# Patient Record
Sex: Male | Born: 1950 | Race: White | Hispanic: No | Marital: Married | Smoking: Never smoker
Health system: Southern US, Community
[De-identification: ages and names within clinical notes are randomized; demographics above are authoritative.]

## PROBLEM LIST (undated history)

## (undated) DIAGNOSIS — N412 Abscess of prostate: Secondary | ICD-10-CM

## (undated) HISTORY — PX: KNEE ARTHROSCOPY W/ MENISCAL REPAIR: SHX1877

---

## 2014-03-19 ENCOUNTER — Emergency Department
Admission: EM | Admit: 2014-03-19 | Discharge: 2014-03-19 | Disposition: A | Discharge status: Home / Self Care | Payer: 59 | Source: Home / Self Care | Attending: Family Medicine | Admitting: Family Medicine

## 2014-03-19 ENCOUNTER — Encounter: Payer: Self-pay | Admitting: Emergency Medicine

## 2014-03-19 DIAGNOSIS — J029 Acute pharyngitis, unspecified: Secondary | ICD-10-CM

## 2014-03-19 HISTORY — DX: Abscess of prostate: N41.2

## 2014-03-19 LAB — POCT RAPID STREP A (OFFICE): RAPID STREP A SCREEN: NEGATIVE

## 2014-03-19 NOTE — Discharge Instructions (Signed)
If increasing cold symptoms develop, try the following:  Take plain guaifenesin 1200mg  (Mucinex) twice daily, with plenty of water, for cough and congestion.  May add Pseudoephedrine for sinus congestion.  Get adequate rest.   May use Afrin nasal spray (or generic oxymetazoline) twice daily for about 5 days.  Also recommend using saline nasal spray several times daily and saline nasal irrigation (AYR is a common brand) Try warm salt water gargles for sore throat.  Stop all antihistamines for now, and other non-prescription cough/cold preparations. May take Ibuprofen 200mg , 4 tabs every 8 hours with food for sore throat, headache, etc. May take Delsym Cough Suppressant at bedtime for nighttime cough.  Follow-up with family doctor if not improving about10 days.

## 2014-03-19 NOTE — ED Notes (Signed)
Pt c/o sore thoat since yesterday. Started suddenly and getting worse. Pt has been afebrile.

## 2014-03-19 NOTE — ED Provider Notes (Signed)
CSN: [id number]     Arrival date & time 03/19/14  0929 History   First MD Initiated Contact with Patient 03/19/14 1003     Chief Complaint  Patient presents with  . Sore Throat      HPI Comments: Patient complains of onset of a sore throat last night.  Today his throat was worse.  He has developed nasal congestion, post-nasal drainage, myalgias, and mild fatigue.  The history is provided by the patient.    Past Medical History  Diagnosis Date  . Prostate abscess    Past Surgical History  Procedure Laterality Date  . Knee arthroscopy w/ meniscal repair     Family History  Problem Relation Age of Onset  . Diabetes Mother   . Hypertension Mother   . Stroke Mother   . Hypertension Sister   . Heart failure Sister    History  Substance Use Topics  . Smoking status: Never Smoker   . Smokeless tobacco: Not on file  . Alcohol Use: 1.2 oz/week    2 Not specified per week    Review of Systems + sore throat + hoarseness No cough No pleuritic pain No wheezing + nasal congestion + post-nasal drainage No sinus pain/pressure No itchy/red eyes No earache No hemoptysis No SOB No fever/chills No nausea No vomiting No abdominal pain No diarrhea No urinary symptoms No skin rash + fatigue + myalgias + headache Used OTC meds without relief  Allergies  Review of patient's allergies indicates no known allergies.  Home Medications   Prior to Admission medications   Not on File   BP 134/84 mmHg  Pulse 78  Temp(Src) 97.5 F (36.4 C) (Oral)  Ht 5' 8.25" (1.734 m)  Wt 178 lb (80.74 kg)  BMI 26.85 kg/m2  SpO2 99% Physical Exam Nursing notes and Vital Signs reviewed. Appearance:  Patient appears healthy, stated age, and in no acute distress Eyes:  Pupils are equal, round, and reactive to light and accomodation.  Extraocular movement is intact.  Conjunctivae are not inflamed  Ears:  Canals normal.  Tympanic membranes normal.  Nose:  Mildly congested turbinates.  No  sinus tenderness.   Pharynx:  Minimal erythema. Neck:  Supple.  Tender enlarged posterior nodes are palpated bilaterally  Lungs:  Clear to auscultation.  Breath sounds are equal.  Heart:  Regular rate and rhythm without murmurs, rubs, or gallops.  Abdomen:  Nontender without masses or hepatosplenomegaly.  Bowel sounds are present.  No CVA or flank tenderness.  Extremities:  No edema.  No calf tenderness Skin:  No rash present.   ED Course  Procedures  None    Labs Reviewed  STREP A DNA PROBE  POCT RAPID STREP A (OFFICE) negative         MDM   1. Acute pharyngitis, unspecified pharyngitis type; suspect early viral URI    Throat culture pending.  There is no evidence of bacterial infection today.   If increasing cold symptoms develop, try the following:  Take plain guaifenesin 1200mg  (Mucinex) twice daily, with plenty of water, for cough and congestion.  May add Pseudoephedrine for sinus congestion.  Get adequate rest.   May use Afrin nasal spray (or generic oxymetazoline) twice daily for about 5 days.  Also recommend using saline nasal spray several times daily and saline nasal irrigation (AYR is a common brand) Try warm salt water gargles for sore throat.  Stop all antihistamines for now, and other non-prescription cough/cold preparations. May take Ibuprofen 200mg , 4 tabs  every 8 hours with food for sore throat, headache, etc. May take Delsym Cough Suppressant at bedtime for nighttime cough.  Follow-up with family doctor if not improving about10 days.     Kandra Nicolas, MD 03/19/14 [phone]

## 2014-03-20 LAB — STREP A DNA PROBE: GASP: NEGATIVE

## 2014-03-22 ENCOUNTER — Telehealth: Payer: Self-pay | Admitting: *Deleted

## 2015-01-21 ENCOUNTER — Emergency Department
Admission: EM | Admit: 2015-01-21 | Discharge: 2015-01-21 | Disposition: A | Discharge status: Home / Self Care | Payer: 59 | Source: Home / Self Care | Attending: Family Medicine | Admitting: Family Medicine

## 2015-01-21 ENCOUNTER — Encounter: Payer: Self-pay | Admitting: Emergency Medicine

## 2015-01-21 DIAGNOSIS — J069 Acute upper respiratory infection, unspecified: Secondary | ICD-10-CM

## 2015-01-21 DIAGNOSIS — B9789 Other viral agents as the cause of diseases classified elsewhere: Principal | ICD-10-CM

## 2015-01-21 LAB — POCT RAPID STREP A (OFFICE): RAPID STREP A SCREEN: NEGATIVE

## 2015-01-21 NOTE — ED Notes (Signed)
Fever, and sore throat x 6 days

## 2015-01-21 NOTE — Discharge Instructions (Signed)
Take plain guaifenesin (1200mg  extended release tabs such as Mucinex) twice daily, with plenty of water, for cough and congestion.  May add Pseudoephedrine (30mg , one or two every 4 to 6 hours) for sinus congestion.  Get adequate rest.   May use Afrin nasal spray (or generic oxymetazoline) twice daily for about 5 days and then discontinue.  Also recommend using saline nasal spray several times daily and saline nasal irrigation (AYR is a common brand).   Try warm salt water gargles for sore throat.  May take Delsym Cough Suppressant at bedtime for nighttime cough.  Stop all antihistamines for now, and other non-prescription cough/cold preparations. May take Ibuprofen 200mg , 4 tabs every 8 hours with food for sore throat, headache, etc.   Follow-up with family doctor if not improving about10 days.

## 2015-01-21 NOTE — ED Provider Notes (Signed)
CSN: [id number]     Arrival date & time 01/21/15  [id number] History   First MD Initiated Contact with Patient 01/21/15 [phone]     Chief Complaint  Patient presents with  . Sore Throat      HPI Comments: One week ago patient developed low grade fever, fatigue, and myalgias.  Three to four days ago he developed a sore throat which persists.  He has had post-nasal drainage.  He has developed increasing cough over the past two days.  The history is provided by the patient.    Past Medical History  Diagnosis Date  . Prostate abscess    Past Surgical History  Procedure Laterality Date  . Knee arthroscopy w/ meniscal repair     Family History  Problem Relation Age of Onset  . Diabetes Mother   . Hypertension Mother   . Stroke Mother   . Hypertension Sister   . Heart failure Sister    Social History  Substance Use Topics  . Smoking status: Never Smoker   . Smokeless tobacco: None  . Alcohol Use: 1.2 oz/week    2 Standard drinks or equivalent per week    Review of Systems + sore throat + cough No pleuritic pain No wheezing No nasal congestion + post-nasal drainage No sinus pain/pressure No itchy/red eyes No earache No hemoptysis No SOB + fever, + chills No nausea No vomiting No abdominal pain No diarrhea No urinary symptoms No skin rash + fatigue +myalgias + headache Used OTC meds without relief  Allergies  Review of patient's allergies indicates not on file.  Home Medications   Prior to Admission medications   Not on File   Meds Ordered and Administered this Visit  Medications - No data to display  BP 133/78 mmHg  Pulse 76  Temp(Src) 98.5 F (36.9 C) (Oral)  Ht 5\' 8"  (1.727 m)  Wt 165 lb (74.844 kg)  BMI 25.09 kg/m2  SpO2 98% No data found.   Physical Exam Nursing notes and Vital Signs reviewed. Appearance:  Patient appears stated age, and in no acute distress Eyes:  Pupils are equal, round, and reactive to light and accomodation.  Extraocular  movement is intact.  Conjunctivae are not inflamed  Ears:  Canals normal.  Tympanic membranes normal.  Nose:  Congested turbinates.  No sinus tenderness.   Pharynx:  Mild swelling uvula Neck:  Supple.   Tender shotty posterior nodes are palpated bilaterally  Lungs:  Clear to auscultation.  Breath sounds are equal.  Moving air well. Heart:  Regular rate and rhythm without murmurs, rubs, or gallops.  Abdomen:  Nontender without masses or hepatosplenomegaly.  Bowel sounds are present.  No CVA or flank tenderness.  Extremities:  No edema.  Skin:  No rash present.   ED Course  Procedures none    Labs Reviewed -  POCT rapid strep test negative    MDM   1. Viral URI with cough     There is no evidence of bacterial infection today.  Will send throat culture Take plain guaifenesin (1200mg  extended release tabs such as Mucinex) twice daily, with plenty of water, for cough and congestion.  May add Pseudoephedrine (30mg , one or two every 4 to 6 hours) for sinus congestion.  Get adequate rest.   May use Afrin nasal spray (or generic oxymetazoline) twice daily for about 5 days and then discontinue.  Also recommend using saline nasal spray several times daily and saline nasal irrigation (AYR is a common brand).  Try warm salt water gargles for sore throat.  May take Delsym Cough Suppressant at bedtime for nighttime cough.  Stop all antihistamines for now, and other non-prescription cough/cold preparations. May take Ibuprofen 200mg , 4 tabs every 8 hours with food for sore throat, headache, etc.   Follow-up with family doctor if not improving about10 days.     Kandra Nicolas, MD 01/21/15 1005

## 2015-01-22 ENCOUNTER — Telehealth: Payer: Self-pay | Admitting: Emergency Medicine

## 2015-01-22 LAB — STREP A DNA PROBE: GASP: NOT DETECTED

## 2015-07-20 DIAGNOSIS — H524 Presbyopia: Secondary | ICD-10-CM | POA: Diagnosis not present

## 2015-11-17 ENCOUNTER — Ambulatory Visit (INDEPENDENT_AMBULATORY_CARE_PROVIDER_SITE_OTHER): Payer: 59 | Admitting: Physician Assistant

## 2015-11-17 ENCOUNTER — Encounter: Payer: Self-pay | Admitting: Physician Assistant

## 2015-11-17 VITALS — BP 134/68 | HR 65 | Ht 68.0 in | Wt 179.0 lb

## 2015-11-17 DIAGNOSIS — L603 Nail dystrophy: Secondary | ICD-10-CM | POA: Diagnosis not present

## 2015-11-17 DIAGNOSIS — Z87438 Personal history of other diseases of male genital organs: Secondary | ICD-10-CM

## 2015-11-17 DIAGNOSIS — Z0000 Encounter for general adult medical examination without abnormal findings: Secondary | ICD-10-CM | POA: Diagnosis not present

## 2015-11-17 DIAGNOSIS — R002 Palpitations: Secondary | ICD-10-CM | POA: Insufficient documentation

## 2015-11-17 DIAGNOSIS — Z131 Encounter for screening for diabetes mellitus: Secondary | ICD-10-CM

## 2015-11-17 DIAGNOSIS — Z1159 Encounter for screening for other viral diseases: Secondary | ICD-10-CM

## 2015-11-17 DIAGNOSIS — Z13220 Encounter for screening for lipoid disorders: Secondary | ICD-10-CM

## 2015-11-17 DIAGNOSIS — R011 Cardiac murmur, unspecified: Secondary | ICD-10-CM

## 2015-11-17 NOTE — Progress Notes (Signed)
Subjective:    Patient ID: Tyler Russo, male    DOB: 10-15-1950, 65 y.o.   MRN: [id number]  HPI Pt is a 65 yo male who presents to the clinic for CPE.   He has been having some palpitations. He has a heart murmur that has been checked 10 plus years ago. Palpitations occur all throughout the day but only last a few seconds. Not tried anything to make better. Exercises daily.   He did injure his left ring finger a few weeks back. It was a crush injury.now the nail bed has a divet in it and he wants it looked at. No pain, swelling or tenderness.    .. Active Ambulatory Problems    Diagnosis Date Noted  . Palpitations 11/17/2015  . Systolic ejection murmur [id number]  . History of BPH 11/17/2015   Resolved Ambulatory Problems    Diagnosis Date Noted  . No Resolved Ambulatory Problems   Past Medical History:  Diagnosis Date  . Prostate abscess    .Marland Kitchen Family History  Problem Relation Age of Onset  . Diabetes Mother   . Hypertension Mother   . Stroke Mother   . Hypertension Sister   . Heart failure Sister   . Cancer Brother     testicular  . Diabetes Paternal Grandmother   . ALS Brother    .Marland Kitchen Social History   Social History  . Marital status: Married    Spouse name: N/A  . Number of children: N/A  . Years of education: N/A   Occupational History  . Not on file.   Social History Main Topics  . Smoking status: Never Smoker  . Smokeless tobacco: Never Used  . Alcohol use 1.2 oz/week    2 Standard drinks or equivalent per week  . Drug use: No  . Sexual activity: Yes   Other Topics Concern  . Not on file   Social History Narrative  . No narrative on file      Review of Systems  All other systems reviewed and are negative.      Objective:   Physical Exam BP 134/68   Pulse 65   Ht 5\' 8"  (1.727 m)   Wt 179 lb (81.2 kg)   BMI 27.22 kg/m   General Appearance:    Alert, cooperative, no distress, appears stated age  Head:    Normocephalic, without  obvious abnormality, atraumatic  Eyes:    PERRL, conjunctiva/corneas clear, EOM's intact, fundi    benign, both eyes       Ears:    Normal TM's and external ear canals, both ears  Nose:   Nares normal, septum midline, mucosa normal, no drainage    or sinus tenderness  Throat:   Lips, mucosa, and tongue normal; teeth and gums normal  Neck:   Supple, symmetrical, trachea midline, no adenopathy;       thyroid:  No enlargement/tenderness/nodules; no carotid   bruit or JVD  Back:     Symmetric, no curvature, ROM normal, no CVA tenderness  Lungs:     Clear to auscultation bilaterally, respirations unlabored  Chest wall:    No tenderness or deformity  Heart:    Regular rate and rhythm, S1 and S2 normal, 2/6 systolic  murmur, rub   or gallop  Abdomen:     Soft, non-tender, bowel sounds active all four quadrants,    no masses, no organomegaly        Extremities:   Extremities normal, atraumatic, no cyanosis or  edema  Pulses:   2+ and symmetric all extremities  Skin:   Skin color, texture, turgor normal, no rashes or lesions/left ring finger dystrophic nail with a divet from just above nail bed where crush injury occured to the tip of the nail.   Lymph nodes:   Cervical, supraclavicular, and axillary nodes normal  Neurologic:   CNII-XII intact. Normal strength, sensation and reflexes      throughout         Assessment & Plan:  Marland KitchenMarland KitchenDiagnoses and all orders for this visit:  Routine physical examination -     Lipid panel -     COMPLETE METABOLIC PANEL WITH GFR -     TSH -     VITAMIN D 25 Hydroxy (Vit-D Deficiency, Fractures) -     B12 -     CBC with Differential/Platelet -     Ferritin -     Hepatitis C Antibody -     PSA  Palpitations -     COMPLETE METABOLIC PANEL WITH GFR -     TSH -     VITAMIN D 25 Hydroxy (Vit-D Deficiency, Fractures) -     B12 -     CBC with Differential/Platelet -     Ferritin -     EKG 12-Lead  Need for hepatitis C screening test -     Hepatitis C  Antibody  Screening for diabetes mellitus -     COMPLETE METABOLIC PANEL WITH GFR  Screening for lipid disorders -     Lipid panel  Systolic ejection murmur  Dystrophic nail -     Fungal Stain  History of BPH   Palpitations-  EKG- no arrhthymias No ST elevation or depression. NSR at 65.  Discussed likely PVC's.  Labs ordered.  Encouraged ASA 81mg  daily.  HO given.  Will get holter monitor to further evaluate.  Discussed symptomatic care with beta blocker. Pt declined today.  Hx of BPH- AUA was 6. Controlled. PSA ordered.   Systolic murmur- discussed echo. Pt declined today. Discussed warning signs we need to look into heart blood flow.

## 2015-11-17 NOTE — Patient Instructions (Signed)
Get labs.  Suggest starting a baby ASA a day.  Will order holter.    Premature Ventricular Contraction A premature ventricular contraction is an irregularity in the normal heart rhythm. These contractions are extra heartbeats that occur too early in the normal sequence. In most cases, these contractions are harmless and do not require treatment. CAUSES Premature ventricular contractions may occur without a known cause. In healthy people, the extra contractions may be caused by:  Smoking.  Drinking alcohol.  Caffeine.  Certain medicines.  Some illegal drugs.  Stress. Sometimes, changes in chemicals in the blood (electrolytes) can also cause premature ventricular contractions. They can also occur in people with heart diseases that cause a decrease in blood flow to the heart. SIGNS AND SYMPTOMS Premature ventricular contractions often do not cause any symptoms. In some cases, you may have a feeling of your heart beating fast or skipping a beat (palpitations). DIAGNOSIS Your health care provider will take your medical history and do a physical exam. During the exam, the health care provider will check for irregular heartbeats. Various tests may be done to help diagnose premature ventricular contractions. These tests may include:  An ECG (electrocardiogram) to monitor the electrical activity of your heart.  Holter monitor testing. A Holter monitor is a portable device that can monitor the electrical activity of your heart over longer periods of time.  Stress tests to see how exercise affects your heart rhythm.  Echocardiogram. This test uses sound waves (ultrasound) to produce an image of your heart.  Electrophysiology study. This is used to evaluate the electrical conduction system of your heart. TREATMENT Usually, no treatment is needed. You may be advised to avoid things that can trigger the premature contractions, such as caffeine or alcohol. Medicines are sometimes given if  symptoms are severe or if the extra heartbeats are very frequent. Treatment may also be needed for an underlying cause of the contractions if one is found. HOME CARE INSTRUCTIONS  Take medicines only as directed by your health care provider.  Make any lifestyle changes recommended by your health care provider. These may include:  Quitting smoking.  Avoiding or limiting caffeine or alcohol.  Exercising. Talk to your health care provider about what type of exercise is safe for you.  Trying to reduce stress.  Keep all follow-up visits with your health care provider. This is important. SEEK IMMEDIATE MEDICAL CARE IF:  You feel palpitations that are frequent or continual.  You have chest pain.  You have shortness of breath.  You have sweating for no reason.  You have nausea and vomiting.  You become light-headed or faint.   This information is not intended to replace advice given to you by your health care provider. Make sure you discuss any questions you have with your health care provider.   Document Released: 09/18/2003 Document Revised: 02/21/2014 Document Reviewed: 07/04/2013 Elsevier Interactive Patient Education [employer].

## 2015-11-20 DIAGNOSIS — R002 Palpitations: Secondary | ICD-10-CM | POA: Diagnosis not present

## 2015-11-20 DIAGNOSIS — Z1159 Encounter for screening for other viral diseases: Secondary | ICD-10-CM | POA: Diagnosis not present

## 2015-11-20 DIAGNOSIS — Z0000 Encounter for general adult medical examination without abnormal findings: Secondary | ICD-10-CM | POA: Diagnosis not present

## 2015-11-20 DIAGNOSIS — Z13220 Encounter for screening for lipoid disorders: Secondary | ICD-10-CM | POA: Diagnosis not present

## 2015-11-20 DIAGNOSIS — Z131 Encounter for screening for diabetes mellitus: Secondary | ICD-10-CM | POA: Diagnosis not present

## 2015-11-20 LAB — CBC WITH DIFFERENTIAL/PLATELET
BASOS ABS: 0 {cells}/uL (ref 0–200)
Basophils Relative: 0 %
Eosinophils Absolute: 124 cells/uL (ref 15–500)
Eosinophils Relative: 2 %
HEMATOCRIT: 40 % (ref 38.5–50.0)
Hemoglobin: 13.8 g/dL (ref 13.2–17.1)
LYMPHS ABS: 1736 {cells}/uL (ref 850–3900)
Lymphocytes Relative: 28 %
MCH: 30.8 pg (ref 27.0–33.0)
MCHC: 34.5 g/dL (ref 32.0–36.0)
MCV: 89.3 fL (ref 80.0–100.0)
MONO ABS: 620 {cells}/uL (ref 200–950)
MPV: 10 fL (ref 7.5–12.5)
Monocytes Relative: 10 %
NEUTROS ABS: 3720 {cells}/uL (ref 1500–7800)
Neutrophils Relative %: 60 %
Platelets: 201 10*3/uL (ref 140–400)
RBC: 4.48 MIL/uL (ref 4.20–5.80)
RDW: 12.9 % (ref 11.0–15.0)
WBC: 6.2 10*3/uL (ref 3.8–10.8)

## 2015-11-20 LAB — COMPLETE METABOLIC PANEL WITH GFR
ALK PHOS: 62 U/L (ref 40–115)
ALT: 19 U/L (ref 9–46)
AST: 24 U/L (ref 10–35)
Albumin: 4.1 g/dL (ref 3.6–5.1)
BILIRUBIN TOTAL: 0.4 mg/dL (ref 0.2–1.2)
BUN: 20 mg/dL (ref 7–25)
CO2: 25 mmol/L (ref 20–31)
Calcium: 9.2 mg/dL (ref 8.6–10.3)
Chloride: 105 mmol/L (ref 98–110)
Creat: 0.91 mg/dL (ref 0.70–1.25)
GFR, Est African American: >89 mL/min (ref >=60)
GFR, Est Non African American: 89 mL/min (ref >=60)
GLUCOSE: 94 mg/dL (ref 65–99)
Potassium: 4.2 mmol/L (ref 3.5–5.3)
SODIUM: 138 mmol/L (ref 135–146)
TOTAL PROTEIN: 7 g/dL (ref 6.1–8.1)

## 2015-11-20 LAB — LIPID PANEL
Cholesterol: 168 mg/dL (ref 125–200)
HDL: 48 mg/dL (ref >=40)
LDL Cholesterol: 102 mg/dL (ref <130)
Total CHOL/HDL Ratio: 3.5 Ratio (ref <=5.0)
Triglycerides: 91 mg/dL (ref <150)
VLDL: 18 mg/dL (ref <30)

## 2015-11-20 LAB — TSH: TSH: 1.95 mIU/L (ref 0.40–4.50)

## 2015-11-20 LAB — VITAMIN B12: Vitamin B-12: 1277 pg/mL — ABNORMAL HIGH (ref 200–1100)

## 2015-11-20 LAB — HEPATITIS C ANTIBODY: HCV AB: NEGATIVE

## 2015-11-20 LAB — FERRITIN: Ferritin: 22 ng/mL (ref 20–380)

## 2015-11-20 LAB — PSA: PSA: 1.4 ng/mL (ref <=4.0)

## 2015-11-20 LAB — FUNGAL STAIN

## 2015-11-20 LAB — VITAMIN D 25 HYDROXY (VIT D DEFICIENCY, FRACTURES): Vit D, 25-Hydroxy: 35 ng/mL (ref 30–100)

## 2015-12-02 ENCOUNTER — Ambulatory Visit (INDEPENDENT_AMBULATORY_CARE_PROVIDER_SITE_OTHER): Payer: 59

## 2015-12-02 DIAGNOSIS — R002 Palpitations: Secondary | ICD-10-CM | POA: Diagnosis not present

## 2015-12-17 ENCOUNTER — Encounter: Payer: Self-pay | Admitting: Physician Assistant

## 2015-12-18 ENCOUNTER — Other Ambulatory Visit: Payer: Self-pay | Admitting: Physician Assistant

## 2015-12-18 DIAGNOSIS — R9431 Abnormal electrocardiogram [ECG] [EKG]: Secondary | ICD-10-CM

## 2015-12-18 DIAGNOSIS — R002 Palpitations: Secondary | ICD-10-CM

## 2015-12-29 ENCOUNTER — Ambulatory Visit (INDEPENDENT_AMBULATORY_CARE_PROVIDER_SITE_OTHER): Payer: 59

## 2015-12-29 ENCOUNTER — Other Ambulatory Visit: Payer: Self-pay

## 2015-12-29 ENCOUNTER — Encounter: Payer: Self-pay | Admitting: Physician Assistant

## 2015-12-29 DIAGNOSIS — R9439 Abnormal result of other cardiovascular function study: Secondary | ICD-10-CM | POA: Insufficient documentation

## 2015-12-29 DIAGNOSIS — R9431 Abnormal electrocardiogram [ECG] [EKG]: Secondary | ICD-10-CM | POA: Diagnosis not present

## 2015-12-29 DIAGNOSIS — R002 Palpitations: Secondary | ICD-10-CM | POA: Diagnosis not present

## 2015-12-29 LAB — EXERCISE TOLERANCE TEST
CHL CUP RESTING HR STRESS: 71 {beats}/min
CHL CUP STRESS STAGE 2 GRADE: 0 %
CHL CUP STRESS STAGE 2 SPEED: 0 mph
CHL CUP STRESS STAGE 3 GRADE: 0 %
CHL CUP STRESS STAGE 3 HR: 79 {beats}/min
CHL CUP STRESS STAGE 5 GRADE: 10 %
CHL CUP STRESS STAGE 5 HR: 118 {beats}/min
CHL CUP STRESS STAGE 6 DBP: 63 mmHg
CHL CUP STRESS STAGE 6 GRADE: 12 %
CHL CUP STRESS STAGE 6 SBP: 186 mmHg
CHL CUP STRESS STAGE 6 SPEED: 2.5 mph
CHL CUP STRESS STAGE 7 HR: 144 {beats}/min
CHL CUP STRESS STAGE 8 HR: 115 {beats}/min
CHL CUP STRESS STAGE 8 SBP: 159 mmHg
CHL CUP STRESS STAGE 9 HR: 84 {beats}/min
CHL RATE OF PERCEIVED EXERTION: 16
CSEPED: 7 min
CSEPEDS: 0 s
CSEPPHR: 144 {beats}/min
CSEPPMHR: 91 %
Estimated workload: 8.5 METS
MPHR: 157 {beats}/min
Percent HR: 91 %
Stage 1 DBP: 84 mmHg
Stage 1 Grade: 0 %
Stage 1 HR: 79 {beats}/min
Stage 1 SBP: 136 mmHg
Stage 1 Speed: 0 mph
Stage 2 HR: 78 {beats}/min
Stage 3 Speed: 1 mph
Stage 4 Grade: 0.1 %
Stage 4 HR: 79 {beats}/min
Stage 4 Speed: 1 mph
Stage 5 DBP: 63 mmHg
Stage 5 SBP: 151 mmHg
Stage 5 Speed: 1.7 mph
Stage 6 HR: 137 {beats}/min
Stage 7 Grade: 14 %
Stage 7 Speed: 3.4 mph
Stage 8 DBP: 68 mmHg
Stage 8 Grade: 0 %
Stage 8 Speed: 0 mph
Stage 9 DBP: 76 mmHg
Stage 9 Grade: 0 %
Stage 9 SBP: 141 mmHg
Stage 9 Speed: 0 mph

## 2016-01-05 ENCOUNTER — Telehealth (HOSPITAL_COMMUNITY): Payer: Self-pay | Admitting: *Deleted

## 2016-01-05 NOTE — Telephone Encounter (Signed)
Patient given detailed instructions per Myocardial Perfusion Study Information Sheet for the test on 01/12/16 at 0715. Patient notified to arrive 15 minutes early and that it is imperative to arrive on time for appointment to keep from having the test rescheduled.  If you need to cancel or reschedule your appointment, please call the office within 24 hours of your appointment. Failure to do so may result in a cancellation of your appointment, and a $50 no show fee. Patient verbalized understanding.Berdell Nevitt, Ranae Palms

## 2016-01-12 ENCOUNTER — Ambulatory Visit (HOSPITAL_COMMUNITY): Payer: 59 | Attending: Internal Medicine

## 2016-01-12 DIAGNOSIS — R9439 Abnormal result of other cardiovascular function study: Secondary | ICD-10-CM | POA: Diagnosis not present

## 2016-01-12 DIAGNOSIS — R002 Palpitations: Secondary | ICD-10-CM | POA: Diagnosis not present

## 2016-01-12 LAB — MYOCARDIAL PERFUSION IMAGING
CHL CUP NUCLEAR SDS: 1
CSEPPHR: 134 {beats}/min
Estimated workload: 10.4 METS
Exercise duration (min): 9 min
Exercise duration (sec): 0 s
LHR: 0.28
LV sys vol: 42 mL
LVDIAVOL: 103 mL (ref 62–150)
MPHR: 157 {beats}/min
Percent HR: 85 %
RPE: 17
Rest HR: 51 {beats}/min
SRS: 5
SSS: 6
TID: 0.95

## 2016-01-12 MED ORDER — TECHNETIUM TC 99M TETROFOSMIN IV KIT
10.5000 | PACK | Freq: Once | INTRAVENOUS | Status: AC | PRN
  Administered 2016-01-12: 10.5 via INTRAVENOUS
  Filled 2016-01-12: qty 11

## 2016-01-12 MED ORDER — TECHNETIUM TC 99M TETROFOSMIN IV KIT
33.0000 | PACK | Freq: Once | INTRAVENOUS | Status: AC | PRN
  Administered 2016-01-12: 33 via INTRAVENOUS
  Filled 2016-01-12: qty 33

## 2016-07-08 ENCOUNTER — Emergency Department (INDEPENDENT_AMBULATORY_CARE_PROVIDER_SITE_OTHER)
Admission: EM | Admit: 2016-07-08 | Discharge: 2016-07-08 | Disposition: A | Discharge status: Home / Self Care | Payer: 59 | Source: Home / Self Care | Attending: Family Medicine | Admitting: Family Medicine

## 2016-07-08 ENCOUNTER — Encounter: Payer: Self-pay | Admitting: Emergency Medicine

## 2016-07-08 DIAGNOSIS — J029 Acute pharyngitis, unspecified: Secondary | ICD-10-CM | POA: Diagnosis not present

## 2016-07-08 LAB — POCT RAPID STREP A (OFFICE): Rapid Strep A Screen: NEGATIVE

## 2016-07-08 NOTE — Discharge Instructions (Signed)
Try warm salt water gargles for sore throat.  May take Ibuprofen 200mg , 4 tabs every 8 hours with food for sore throat  If increasing cold symptoms start, try the following: Take plain guaifenesin (1200mg  extended release tabs such as Mucinex) twice daily, with plenty of water, for cough and congestion.  May add Pseudoephedrine (30mg , one or two every 4 to 6 hours) for sinus congestion.  Get adequate rest.   May use Afrin nasal spray (or generic oxymetazoline) each morning for about 5 days and then discontinue.  Also recommend using saline nasal spray several times daily and saline nasal irrigation (AYR is a common brand).  Use Flonase nasal spray each morning after using Afrin nasal spray and saline nasal irrigation. May take Delsym Cough Suppressant at bedtime for nighttime cough.  Stop all antihistamines for now, and other non-prescription cough/cold preparations.

## 2016-07-08 NOTE — ED Triage Notes (Signed)
Sore throat, fatigue, headache x 5 days

## 2016-07-08 NOTE — ED Provider Notes (Signed)
[facility]    CSN: [id number] Arrival date & time: 07/08/16  0820     History   Chief Complaint Chief Complaint  Patient presents with  . Sore Throat    HPI Tyler Russo is a 66 y.o. male.   Patient developed a sore throat four days ago that has been gradually worsening.  He has had mild nasal congestion but no other URI symptoms.  No fevers, chills, and sweats.  His 48 year old grandson was treated for strep pharyngitis 1.5 weeks ago.   The history is provided by the patient.    Past Medical History:  Diagnosis Date  . Prostate abscess     Patient Active Problem List   Diagnosis Date Noted  . Abnormal stress test 12/29/2015  . Palpitations 11/17/2015  . Systolic ejection murmur 81/02/7508  . History of BPH 11/17/2015    Past Surgical History:  Procedure Laterality Date  . KNEE ARTHROSCOPY W/ MENISCAL REPAIR         Home Medications    Prior to Admission medications   Medication Sig Start Date End Date Taking? Authorizing Provider  ibuprofen (ADVIL,MOTRIN) 200 MG tablet Take 200 mg by mouth every 6 (six) hours as needed.   Yes [provider]    Family History Family History  Problem Relation Age of Onset  . Diabetes Mother   . Hypertension Mother   . Stroke Mother   . Hypertension Sister   . Heart failure Sister   . Cancer Brother        testicular  . Diabetes Paternal Grandmother   . ALS Brother     Social History Social History  Substance Use Topics  . Smoking status: Never Smoker  . Smokeless tobacco: Never Used  . Alcohol use 1.2 oz/week    2 Standard drinks or equivalent per week     Allergies   Patient has no known allergies.   Review of Systems Review of Systems + sore throat No cough No pleuritic pain No wheezing + nasal congestion ? post-nasal drainage No sinus pain/pressure No itchy/red eyes No earache No hemoptysis No SOB No fever/chills No nausea No vomiting No abdominal pain No  diarrhea No urinary symptoms No skin rash No fatigue No myalgias No headache Used OTC meds without relief   Physical Exam Triage Vital Signs ED Triage Vitals  Enc Vitals Group     BP 07/08/16 0835 135/82     Pulse Rate 07/08/16 0835 77     Resp --      Temp 07/08/16 0835 98.3 F (36.8 C)     Temp Source 07/08/16 0835 Oral     SpO2 07/08/16 0835 97 %     Weight 07/08/16 0835 168 lb (76.2 kg)     Height 07/08/16 0835 5\' 8"  (1.727 m)     Head Circumference --      Peak Flow --      Pain Score 07/08/16 0836 6     Pain Loc --      Pain Edu? --      Excl. in [place]? --    No data found.   Updated Vital Signs BP 135/82 (BP Location: Left Arm)   Pulse 77   Temp 98.3 F (36.8 C) (Oral)   Ht 5\' 8"  (1.727 m)   Wt 168 lb (76.2 kg)   SpO2 97%   BMI 25.54 kg/m   Visual Acuity Right Eye Distance:   Left Eye Distance:   Bilateral  Distance:    Right Eye Near:   Left Eye Near:    Bilateral Near:     Physical Exam Nursing notes and Vital Signs reviewed. Appearance:  Patient appears stated age, and in no acute distress Eyes:  Pupils are equal, round, and reactive to light and accomodation.  Extraocular movement is intact.  Conjunctivae are not inflamed  Ears:  Canals normal.  Tympanic membranes normal.  Nose:  Mildly congested turbinates.  No sinus tenderness.  Pharynx:  Mild erythema of uvula. Neck:  Supple.  Tender enlarged posterior/lateral nodes are palpated bilaterally  Lungs:  Clear to auscultation.  Breath sounds are equal.  Moving air well. Heart:  Regular rate and rhythm without murmurs, rubs, or gallops.  Abdomen:  Nontender without masses or hepatosplenomegaly.  Bowel sounds are present.  No CVA or flank tenderness.  Extremities:  No edema.  Skin:  No rash present.    UC Treatments / Results  Labs (all labs ordered are listed, but only abnormal results are displayed) Labs Reviewed - No data to display  EKG  EKG Interpretation None       Radiology No  results found.  Procedures Procedures (including critical care time)  Medications Ordered in UC Medications - No data to display   Initial Impression / Assessment and Plan / UC Course  I have reviewed the triage vital signs and the nursing notes.  Pertinent labs & imaging results that were available during my care of the patient were reviewed by me and considered in my medical decision making (see chart for details).    Suspect early viral URI.  There is no evidence of bacterial infection today.   Throat culture pending. Treat symptomatically for now  Try warm salt water gargles for sore throat.  May take Ibuprofen 200mg , 4 tabs every 8 hours with food for sore throat  If increasing cold symptoms start, try the following: Take plain guaifenesin (1200mg  extended release tabs such as Mucinex) twice daily, with plenty of water, for cough and congestion.  May add Pseudoephedrine (30mg , one or two every 4 to 6 hours) for sinus congestion.  Get adequate rest.   May use Afrin nasal spray (or generic oxymetazoline) each morning for about 5 days and then discontinue.  Also recommend using saline nasal spray several times daily and saline nasal irrigation (AYR is a common brand).  Use Flonase nasal spray each morning after using Afrin nasal spray and saline nasal irrigation. May take Delsym Cough Suppressant at bedtime for nighttime cough.  Stop all antihistamines for now, and other non-prescription cough/cold preparations.    Final Clinical Impressions(s) / UC Diagnoses   Final diagnoses:  Pharyngitis, unspecified etiology    New Prescriptions New Prescriptions   No medications on file     Kandra Nicolas, MD 07/08/16 [phone]

## 2016-07-09 ENCOUNTER — Telehealth: Payer: Self-pay | Admitting: Emergency Medicine

## 2016-07-09 LAB — STREP A DNA PROBE: GASP: NOT DETECTED

## 2016-07-09 NOTE — Telephone Encounter (Signed)
Pt informed of results and states that he's doing better.  [place]

## 2016-08-22 ENCOUNTER — Encounter: Payer: Self-pay | Admitting: Physician Assistant

## 2016-08-22 DIAGNOSIS — Z789 Other specified health status: Secondary | ICD-10-CM

## 2016-09-13 DIAGNOSIS — L821 Other seborrheic keratosis: Secondary | ICD-10-CM | POA: Diagnosis not present

## 2016-09-13 DIAGNOSIS — D225 Melanocytic nevi of trunk: Secondary | ICD-10-CM | POA: Diagnosis not present

## 2016-09-13 DIAGNOSIS — D485 Neoplasm of uncertain behavior of skin: Secondary | ICD-10-CM | POA: Diagnosis not present

## 2016-09-13 DIAGNOSIS — L219 Seborrheic dermatitis, unspecified: Secondary | ICD-10-CM | POA: Diagnosis not present

## 2016-09-13 MED FILL — FLUOCINOLONE 0.01% SCALP OI: 0.01 | 15 days supply | Qty: 118 | Fill #0

## 2016-09-13 MED FILL — KETOCONAZOLE 2% SHAMPOO: 2 | 20 days supply | Qty: 120 | Fill #0

## 2016-09-23 DIAGNOSIS — H524 Presbyopia: Secondary | ICD-10-CM | POA: Diagnosis not present

## 2016-10-20 MED FILL — CICLOPIROX 1% SHAMPOO: 1 | 30 days supply | Qty: 120 | Fill #0

## 2016-12-02 ENCOUNTER — Ambulatory Visit (INDEPENDENT_AMBULATORY_CARE_PROVIDER_SITE_OTHER): Payer: 59 | Admitting: Physician Assistant

## 2016-12-02 ENCOUNTER — Encounter: Payer: Self-pay | Admitting: Physician Assistant

## 2016-12-02 VITALS — BP 114/74 | HR 80 | Temp 98.4°F | Wt 181.0 lb

## 2016-12-02 DIAGNOSIS — R05 Cough: Secondary | ICD-10-CM | POA: Diagnosis not present

## 2016-12-02 DIAGNOSIS — R058 Other specified cough: Secondary | ICD-10-CM

## 2016-12-02 MED ORDER — HYDROCOD POLST-CPM POLST ER 10-8 MG/5ML PO SUER: 5.0000 mL | Freq: Every evening | ORAL | 0 refills | Status: DC | PRN

## 2016-12-02 MED ORDER — BENZONATATE 200 MG PO CAPS: 200.0000 mg | ORAL_CAPSULE | Freq: Three times a day (TID) | ORAL | 0 refills | Status: DC | PRN

## 2016-12-02 MED ORDER — AZITHROMYCIN 250 MG PO TABS: ORAL_TABLET | ORAL | 0 refills | Status: DC

## 2016-12-02 MED FILL — AZITHROMYCIN 250 MG TABLET: 250 | 5 days supply | Qty: 6 | Fill #0

## 2016-12-02 MED FILL — HYDROCODONE-CHLORPHENIRAM S: 10-8 | 24 days supply | Qty: 120 | Fill #0

## 2016-12-02 MED FILL — BENZONATATE 200 MG CAP: 200 | 15 days supply | Qty: 45 | Fill #0

## 2016-12-02 NOTE — Progress Notes (Signed)
HPI:                                                                Tyler Russo is a 66 y.o. male who presents to [facility]: [address] today for cough  Cough  This is a new problem. The current episode started in the past 7 days. The problem has been gradually worsening. The problem occurs every few minutes. The cough is productive of purulent sputum. Associated symptoms include a fever (100-102.5) and a sore throat. Pertinent negatives include no hemoptysis. Risk factors for lung disease include travel (x 3 weeks). Treatments tried: Advil. The treatment provided no relief. There is no history of asthma, COPD or pneumonia.     Past Medical History:  Diagnosis Date  . Prostate abscess    Past Surgical History:  Procedure Laterality Date  . KNEE ARTHROSCOPY W/ MENISCAL REPAIR     Social History  Substance Use Topics  . Smoking status: Never Smoker  . Smokeless tobacco: Never Used  . Alcohol use 1.2 oz/week    2 Standard drinks or equivalent per week   family history includes ALS in his brother; Cancer in his brother; Diabetes in his mother and paternal grandmother; Heart failure in his sister; Hypertension in his mother and sister; Stroke in his mother.  ROS: negative except as noted in the HPI  Medications: Current Outpatient Prescriptions  Medication Sig Dispense Refill  . ibuprofen (ADVIL,MOTRIN) 200 MG tablet Take 200 mg by mouth every 6 (six) hours as needed.     No current facility-administered medications for this visit.    No Known Allergies     Objective:  BP 114/74   Pulse 80   Temp 98.4 F (36.9 C) (Oral)   Wt 181 lb (82.1 kg)   SpO2 95%   BMI 27.52 kg/m  Gen:  alert, ill-appearing, not toxic-appearing, no distress, appropriate for age 71: head normocephalic without obvious abnormality, conjunctiva and cornea clear, oropharynx with erythema, left tonsillar pillar with erythematous nodule, trachea midline Pulm:  Normal work of breathing, normal phonation, clear to auscultation bilaterally, no wheezes, rales or rhonchi CV: Normal rate, regular rhythm, s1 and s2 distinct, grade II-III/VI systolic murmur, clicks or rubs  Neuro: alert and oriented x 3, no tremor MSK: extremities atraumatic, normal gait and station Skin: intact, no rashes on exposed skin, no cyanosis   No flowsheet data found.   No results found for this or any previous visit (from the past 72 hour(s)). No results found.    Assessment and Plan: 66 y.o. male with   1. Cough productive of purulent sputum - vital signs reviewed and normal. No tachycardia, fever, or adventitious lung sounds on exam. No indication for chest x-ray today. Patient has recently traveled by airplane to [place], symptoms present for >1 week, worsening. Will cover for CAP and pertussis empirically with Macrolide. Symptomatic management with cough suppressant. Follow-up as needed if no improvement. - chlorpheniramine-HYDROcodone ([place]) 10-8 MG/5ML SUER; Take 5 mLs by mouth at bedtime as needed for cough (cough, will cause drowsiness.).  Dispense: 120 mL; Refill: 0 - azithromycin (ZITHROMAX Z-PAK) 250 MG tablet; Take 2 tablets (500 mg) on  Day 1,  followed by 1 tablet (250 mg) once daily on Days  2 through 5.  Dispense: 6 tablet; Refill: 0 - benzonatate (TESSALON) 200 MG capsule; Take 1 capsule (200 mg total) by mouth 3 (three) times daily as needed for cough.  Dispense: 45 capsule; Refill: 0   Patient education and anticipatory guidance given Patient agrees with treatment plan Follow-up as needed if symptoms worsen or fail to improve  Darlyne Russian PA-C

## 2016-12-02 NOTE — Patient Instructions (Signed)
Cough, Adult Coughing is a reflex that clears your throat and your airways. Coughing helps to heal and protect your lungs. It is normal to cough occasionally, but a cough that happens with other symptoms or lasts a long time may be a sign of a condition that needs treatment. A cough may last only 2-3 weeks (acute), or it may last longer than 8 weeks (chronic). What are the causes? Coughing is commonly caused by:  Breathing in substances that irritate your lungs.  A viral or bacterial respiratory infection.  Allergies.  Asthma.  Postnasal drip.  Smoking.  Acid backing up from the stomach into the esophagus (gastroesophageal reflux).  Certain medicines.  Chronic lung problems, including COPD (or rarely, lung cancer).  Other medical conditions such as heart failure.  Follow these instructions at home: Pay attention to any changes in your symptoms. Take these actions to help with your discomfort:  Take medicines only as told by your health care provider. ? If you were prescribed an antibiotic medicine, take it as told by your health care provider. Do not stop taking the antibiotic even if you start to feel better. ? Talk with your health care provider before you take a cough suppressant medicine.  Drink enough fluid to keep your urine clear or pale yellow.  If the air is dry, use a cold steam vaporizer or humidifier in your bedroom or your home to help loosen secretions.  Avoid anything that causes you to cough at work or at home.  If your cough is worse at night, try sleeping in a semi-upright position.  Avoid cigarette smoke. If you smoke, quit smoking. If you need help quitting, ask your health care provider.  Avoid caffeine.  Avoid alcohol.  Rest as needed.  Contact a health care provider if:  You have new symptoms.  You cough up pus.  Your cough does not get better after 2-3 weeks, or your cough gets worse.  You cannot control your cough with suppressant  medicines and you are losing sleep.  You develop pain that is getting worse or pain that is not controlled with pain medicines.  You have a fever.  You have unexplained weight loss.  You have night sweats. Get help right away if:  You cough up blood.  You have difficulty breathing.  Your heartbeat is very fast. This information is not intended to replace advice given to you by your health care provider. Make sure you discuss any questions you have with your health care provider. Document Released: 07/30/2010 Document Revised: 07/09/2015 Document Reviewed: 04/09/2014 Elsevier Interactive Patient Education  2017 Elsevier Inc.  

## 2017-03-01 ENCOUNTER — Ambulatory Visit (INDEPENDENT_AMBULATORY_CARE_PROVIDER_SITE_OTHER): Payer: No Typology Code available for payment source | Admitting: Physician Assistant

## 2017-03-01 ENCOUNTER — Encounter: Payer: Self-pay | Admitting: Physician Assistant

## 2017-03-01 VITALS — BP 126/66 | HR 70 | Ht 68.0 in | Wt 180.0 lb

## 2017-03-01 DIAGNOSIS — Z0000 Encounter for general adult medical examination without abnormal findings: Secondary | ICD-10-CM

## 2017-03-01 DIAGNOSIS — R011 Cardiac murmur, unspecified: Secondary | ICD-10-CM | POA: Diagnosis not present

## 2017-03-01 DIAGNOSIS — I493 Ventricular premature depolarization: Secondary | ICD-10-CM | POA: Diagnosis not present

## 2017-03-01 DIAGNOSIS — Z131 Encounter for screening for diabetes mellitus: Secondary | ICD-10-CM

## 2017-03-01 DIAGNOSIS — G43109 Migraine with aura, not intractable, without status migrainosus: Secondary | ICD-10-CM

## 2017-03-01 DIAGNOSIS — I4719 Other supraventricular tachycardia: Secondary | ICD-10-CM

## 2017-03-01 DIAGNOSIS — Z13220 Encounter for screening for lipoid disorders: Secondary | ICD-10-CM | POA: Diagnosis not present

## 2017-03-01 DIAGNOSIS — I471 Supraventricular tachycardia: Secondary | ICD-10-CM | POA: Diagnosis not present

## 2017-03-01 MED ORDER — ATENOLOL 25 MG PO TABS: 25.0000 mg | ORAL_TABLET | Freq: Every day | ORAL | 5 refills | Status: DC

## 2017-03-01 MED FILL — ATENOLOL 25 MG TABLET: 25 | 90 days supply | Qty: 90 | Fill #0

## 2017-03-01 NOTE — Progress Notes (Addendum)
Subjective:    Patient ID: Tyler Russo, male    DOB: 1950-07-10, 67 y.o.   MRN: [id number]  HPI  Pt is a 67 year old male who presents to the clinic for CPE.   He continues to have some palpitations that are more noticeable at night and are bothering him. He had a full work-up including nuclear echo with cardiology and they reported rare PVCs but gave him no medications. He reports they said it was up to him whether to begin a medication such as a beta blocker to manage his symptoms.  He also reports ocular migraines about 4 times a year but they typically only last 20 minutes and are not that bothersome. He denies issues with abdominal pain, night sweats, and urinary retention or urgency. He reports healthy diet and walks for exercise typically a couple miles a day. He has no other complaints.   .. Active Ambulatory Problems    Diagnosis Date Noted  . Palpitations 11/17/2015  . Systolic ejection murmur 94/76/5465  . History of BPH 11/17/2015  . Abnormal stress test 12/29/2015  . 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 PVC's (premature ventricular contractions) 03/01/2017  . Atrial tachycardia determined by electrocardiography ([facility]) 03/01/2017   Resolved Ambulatory Problems    Diagnosis Date Noted  . No Resolved Ambulatory Problems   Past Medical History:  Diagnosis Date  . Prostate abscess    .Marland Kitchen Family History  Problem Relation Age of Onset  . Diabetes Mother   . Hypertension Mother   . Stroke Mother   . Hypertension Sister   . Heart failure Sister   . Cancer Brother        testicular  . Diabetes Paternal Grandmother   . ALS Brother    .Marland Kitchen Social History   Socioeconomic History  . Marital status: Married    Spouse name: Not on file  . Number of children: Not on file  . Years of education: Not on file  . Highest education level: Not on file  Social Needs  . Financial resource strain: Not on file  . Food insecurity - worry: Not on file  . Food insecurity - inability: Not  on file  . Transportation needs - medical: Not on file  . Transportation needs - non-medical: Not on file  Occupational History  . Not on file  Tobacco Use  . Smoking status: Never Smoker  . Smokeless tobacco: Never Used  Substance and Sexual Activity  . Alcohol use: Yes    Alcohol/week: 1.2 oz    Types: 2 Standard drinks or equivalent per week  . Drug use: No  . Sexual activity: Yes  Other Topics Concern  . Not on file  Social History Narrative  . Not on file     Review of Systems  Constitutional: Negative for chills, fatigue and fever.  Respiratory: Negative for shortness of breath.   Cardiovascular: Positive for palpitations. Negative for chest pain.       Objective:   Physical Exam BP 126/66   Pulse 70   Ht 5\' 8"  (1.727 m)   Wt 180 lb (81.6 kg)   BMI 27.37 kg/m   General Appearance:    Alert, cooperative, no distress, appears stated age  Head:    Normocephalic, without obvious abnormality, atraumatic  Eyes:    PERRL, conjunctiva/corneas clear, EOM's intact, fundi    benign, both eyes       Ears:    Normal TM's and external ear canals, both ears  Nose:  Nares normal, septum midline, mucosa normal, no drainage    or sinus tenderness  Throat:   Lips, mucosa, and tongue normal; teeth and gums normal  Neck:   Supple, symmetrical, trachea midline, no adenopathy;       thyroid:  No enlargement/tenderness/nodules; no carotid   bruit or JVD  Back:     Symmetric, no curvature, ROM normal, no CVA tenderness  Lungs:     Clear to auscultation bilaterally, respirations unlabored  Chest wall:    No tenderness or deformity  Heart:    Regular rate and rhythm, S1 and S2 normal, no, rub  Or gallop. Systolic murmur auscultated.   Abdomen:     Soft, non-tender, bowel sounds active all four quadrants,    no masses, no organomegaly        Extremities:   Extremities normal, atraumatic, no cyanosis or edema  Pulses:   2+ and symmetric all extremities  Skin:   Skin color, texture,  turgor normal, no rashes or lesions  Lymph nodes:   Cervical, supraclavicular, and axillary nodes normal  Neurologic:   CNII-XII  Grossly intact. Normal strength, sensation and reflexes      throughout         Assessment & Plan:  Tyler Russo was seen today for annual exam.  Diagnoses and all orders for this visit:  Routine physical examination -     Lipid Panel w/reflex Direct LDL -     COMPLETE METABOLIC PANEL WITH GFR -     CBC -     T01  Systolic ejection murmur  Screening for diabetes mellitus -     COMPLETE METABOLIC PANEL WITH GFR  Screening for lipid disorders -     Lipid Panel w/reflex Direct LDL  Ocular migraine  PVC's (premature ventricular contractions) -     atenolol (TENORMIN) 25 MG tablet; Take 1 tablet (25 mg total) by mouth daily.  Atrial tachycardia determined by electrocardiography ([facility]) -     atenolol (TENORMIN) 25 MG tablet; Take 1 tablet (25 mg total) by mouth daily.  .. Depression screen [facility] 2/9 03/01/2017  Decreased Interest 0  Down, Depressed, Hopeless 0  PHQ - 2 Score 0   .Marland KitchenStart a regular exercise program and make sure you are eating a healthy diet Try to eat 4 servings of dairy a day or take a calcium supplement (500mg  twice a day). Your vaccines are up to date except for shingles.  Colonoscopy up to date.   Patient has been given information on the Shingrix Vaccine and Pneumococcal vaccine. He has decided that he will call and get both vaccines once he is 67.   He is receiving routine labs today. He is encouraged to continue his exercise and healthy diet. It was also recommended to him to start 81mg  aspirin today for general heart health. He will start by taking half of a 25mg  atenolol tablet to see if this controls his symptoms. If he does not feel more symptomatic relief he can begin taking a full pill daily. Discussed warning of low BP and/or HR.  If he has any questions he can call of the office or message me on myChart.   Follow up in 3  months.

## 2017-03-01 NOTE — Patient Instructions (Signed)

## 2017-03-12 ENCOUNTER — Encounter: Payer: Self-pay | Admitting: Physician Assistant

## 2017-03-17 NOTE — Progress Notes (Signed)
Call pt: cholesterol is great.  Glucose a little elevated. Lets add a1c.  Kidney, liver look great.

## 2017-03-20 LAB — COMPLETE METABOLIC PANEL WITH GFR
AG Ratio: 1.8 (calc) (ref 1.0–2.5)
ALBUMIN MSPROF: 4.3 g/dL (ref 3.6–5.1)
ALKALINE PHOSPHATASE (APISO): 66 U/L (ref 40–115)
ALT: 20 U/L (ref 9–46)
AST: 18 U/L (ref 10–35)
BUN: 20 mg/dL (ref 7–25)
CO2: 29 mmol/L (ref 20–32)
CREATININE: 0.94 mg/dL (ref 0.70–1.25)
Calcium: 9.3 mg/dL (ref 8.6–10.3)
Chloride: 105 mmol/L (ref 98–110)
GFR, Est African American: 99 mL/min/{1.73_m2} (ref >=60)
GFR, Est Non African American: 85 mL/min/{1.73_m2} (ref >=60)
GLOBULIN: 2.4 g/dL (ref 1.9–3.7)
Glucose, Bld: 104 mg/dL — ABNORMAL HIGH (ref 65–99)
Potassium: 4.1 mmol/L (ref 3.5–5.3)
SODIUM: 139 mmol/L (ref 135–146)
Total Bilirubin: 0.5 mg/dL (ref 0.2–1.2)
Total Protein: 6.7 g/dL (ref 6.1–8.1)

## 2017-03-20 LAB — LIPID PANEL W/REFLEX DIRECT LDL
CHOLESTEROL: 162 mg/dL (ref <200)
HDL: 51 mg/dL (ref >40)
LDL Cholesterol (Calc): 92 mg/dL (calc)
Non-HDL Cholesterol (Calc): 111 mg/dL (calc) (ref <130)
Total CHOL/HDL Ratio: 3.2 (calc) (ref <5.0)
Triglycerides: 91 mg/dL (ref <150)

## 2017-03-20 LAB — CBC
HCT: 38.8 % (ref 38.5–50.0)
Hemoglobin: 13.5 g/dL (ref 13.2–17.1)
MCH: 31.3 pg (ref 27.0–33.0)
MCHC: 34.8 g/dL (ref 32.0–36.0)
MCV: 89.8 fL (ref 80.0–100.0)
MPV: 10.7 fL (ref 7.5–12.5)
PLATELETS: 199 10*3/uL (ref 140–400)
RBC: 4.32 10*6/uL (ref 4.20–5.80)
RDW: 12.2 % (ref 11.0–15.0)
WBC: 6.2 10*3/uL (ref 3.8–10.8)

## 2017-03-20 LAB — VITAMIN B12: Vitamin B-12: 1218 pg/mL — ABNORMAL HIGH (ref 200–1100)

## 2017-03-20 LAB — HEMOGLOBIN A1C W/OUT EAG: Hgb A1c MFr Bld: 5.3 % of total Hgb (ref <5.7)

## 2017-03-20 NOTE — Progress Notes (Signed)
Call pt: A!C is great. Normal range.

## 2017-03-22 ENCOUNTER — Encounter: Payer: Self-pay | Admitting: Physician Assistant

## 2017-03-25 ENCOUNTER — Encounter: Payer: Self-pay | Admitting: Physician Assistant

## 2017-04-03 ENCOUNTER — Encounter: Payer: Self-pay | Admitting: Physician Assistant

## 2017-04-12 ENCOUNTER — Encounter: Payer: Self-pay | Admitting: Physician Assistant

## 2017-04-12 DIAGNOSIS — R011 Cardiac murmur, unspecified: Secondary | ICD-10-CM

## 2017-04-12 DIAGNOSIS — R9439 Abnormal result of other cardiovascular function study: Secondary | ICD-10-CM

## 2017-04-12 DIAGNOSIS — I471 Supraventricular tachycardia: Secondary | ICD-10-CM

## 2017-04-12 DIAGNOSIS — I493 Ventricular premature depolarization: Secondary | ICD-10-CM

## 2017-04-12 NOTE — Telephone Encounter (Signed)
Yes for palpitations/PVC's

## 2017-04-14 ENCOUNTER — Ambulatory Visit (INDEPENDENT_AMBULATORY_CARE_PROVIDER_SITE_OTHER): Payer: No Typology Code available for payment source | Admitting: Cardiovascular Disease

## 2017-04-14 ENCOUNTER — Encounter: Payer: Self-pay | Admitting: Cardiovascular Disease

## 2017-04-14 DIAGNOSIS — R002 Palpitations: Secondary | ICD-10-CM

## 2017-04-14 DIAGNOSIS — R011 Cardiac murmur, unspecified: Secondary | ICD-10-CM | POA: Diagnosis not present

## 2017-04-14 NOTE — Addendum Note (Signed)
Addended by: Therisa Doyne on: 04/14/2017 04:10 PM   Modules accepted: Orders

## 2017-04-14 NOTE — Assessment & Plan Note (Signed)
History of cardiac murmur which he has known about for at least a decade. He is a loud apical systolic murmur consistent with mitral regurgitation. Check a 2-D echocardiogram.

## 2017-04-14 NOTE — Progress Notes (Signed)
04/14/2017 Tyler Russo   [birth year]  [id number]  Primary Physician Donella Stade, PA-C Primary Cardiologist: Lorretta Harp MD Lupe Carney, [place]  HPI:  Tyler Russo is a 67 y.o. fit-appearing married Caucasian male father of 72, grandfather and 6 grandchildren referred for cardiac vessel evaluation because of symptomatic palpitations. His primary care provider is [facility]. 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 He had a negative Myoview stress test performed 01/12/16 and an event monitor that showed nonspecific sustained. Atrial tachycardia without other sustained rhythms, rare PVCs. He briefly has no cardiac risk factors. He denies chest pain or shortness of breath. Never had a heart attack or stroke. He does have a 10 year history of a cardiac murmur. This has not been evaluated recently. He is fairly active and walks 2-3 miles a day with his wife.   No outpatient medications have been marked as taking for the 04/14/17 encounter (Office Visit) with Lorretta Harp, MD.     No Known Allergies  Social History   Socioeconomic History  . Marital status: Married    Spouse name: Not on file  . Number of children: Not on file  . Years of education: Not on file  . Highest education level: Not on file  Social Needs  . Financial resource strain: Not on file  . Food insecurity - worry: Not on file  . Food insecurity - inability: Not on file  . Transportation needs - medical: Not on file  . Transportation needs - non-medical: Not on file  Occupational History  . Not on file  Tobacco Use  . Smoking status: Never Smoker  . Smokeless tobacco: Never Used  Substance and Sexual Activity  . Alcohol use: Yes    Alcohol/week: 1.2 oz    Types: 2 Standard drinks or equivalent per week  . Drug use: No  . Sexual activity: Yes  Other Topics Concern  . Not on file  Social History Narrative    . Not on file     Review of Systems: General: negative for chills, fever, night sweats or weight changes.  Cardiovascular: negative for chest pain, dyspnea on exertion, edema, orthopnea, palpitations, paroxysmal nocturnal dyspnea or shortness of breath Dermatological: negative for rash Respiratory: negative for cough or wheezing Urologic: negative for hematuria Abdominal: negative for nausea, vomiting, diarrhea, bright red blood per rectum, melena, or hematemesis Neurologic: negative for visual changes, syncope, or dizziness All other systems reviewed and are otherwise negative except as noted above.    Blood pressure 126/72, pulse 80, height 5\' 8"  (1.727 m), weight 183 lb (83 kg).  General appearance: alert and no distress Neck: no adenopathy, no carotid bruit, no JVD, supple, symmetrical, trachea midline and thyroid not enlarged, symmetric, no tenderness/mass/nodules Lungs: clear to auscultation bilaterally Heart: 3/6 apical systolic murmur consistent with mitral regurgitation. Extremities: extremities normal, atraumatic, no cyanosis or edema Pulses: 2+ and symmetric Skin: Skin color, texture, turgor normal. No rashes or lesions Neurologic: Alert and oriented X 3, normal strength and tone. Normal symmetric reflexes. Normal coordination and gait  EKG sinus rhythm at 80 with nonspecific ST and T-wave changes. I personally reviewed this EKG  ASSESSMENT AND PLAN:   Palpitations Several year history of palpitations with event monitor performed 12/02/15 revealing rare PVCs with nonsustained atrial tachyarrhythmias. No other sustained ventricular or atrial arrhythmias. He's had these palpitations for several years. They've a noticeable principally in  the evening hours. He drinks one to 2 cups of coffee a day. I'm going to repeat a 2 week event monitor.  Cardiac murmur History of cardiac murmur which he has known about for at least a decade. He is a loud apical systolic murmur consistent  with mitral regurgitation. Check a 2-D echocardiogram.      Lorretta Harp MD [facility] 04/14/2017 4:03 PM

## 2017-04-14 NOTE — Assessment & Plan Note (Signed)
Several year history of palpitations with event monitor performed 12/02/15 revealing rare PVCs with nonsustained atrial tachyarrhythmias. No other sustained ventricular or atrial arrhythmias. He's had these palpitations for several years. They've a noticeable principally in the evening hours. He drinks one to 2 cups of coffee a day. I'm going to repeat a 2 week event monitor.

## 2017-04-14 NOTE — Patient Instructions (Signed)
Medication Instructions: Your physician recommends that you continue on your current medications as directed. Please refer to the Current Medication list given to you today.    Testing/Procedures: Your physician has recommended that you wear a 30 day event monitor. Event monitors are medical devices that record the heart's electrical activity. Doctors most often [place] these monitors to diagnose arrhythmias. Arrhythmias are problems with the speed or rhythm of the heartbeat. The monitor is a small, portable device. You can wear one while you do your normal daily activities. This is usually used to diagnose what is causing palpitations/syncope (passing out).  Your physician has requested that you have an echocardiogram. Echocardiography is a painless test that uses sound waves to create images of your heart. It provides your doctor with information about the size and shape of your heart and how well your heart's chambers and valves are working. This procedure takes approximately one hour. There are no restrictions for this procedure.  Follow-Up: Your physician recommends that you schedule a follow-up appointment after testing with Dr. Gwenlyn Found.

## 2017-04-24 ENCOUNTER — Telehealth: Payer: No Typology Code available for payment source | Admitting: Family

## 2017-04-24 DIAGNOSIS — J029 Acute pharyngitis, unspecified: Secondary | ICD-10-CM | POA: Diagnosis not present

## 2017-04-24 MED ORDER — BENZONATATE 100 MG PO CAPS: 100.0000 mg | ORAL_CAPSULE | Freq: Three times a day (TID) | ORAL | 0 refills | Status: DC | PRN

## 2017-04-24 NOTE — Progress Notes (Signed)
Thank you for the details you included in the comment boxes. Those details are very helpful in determining the best course of treatment for you and help [place] to provide the best care. Regarding the need for sleep every hour or two, if this does not improve soon, you should be evaluated face-to-face.  We are sorry that you are not feeling well.  Here is how we plan to help!  Based on your presentation I believe you most likely have A cough due to a virus.  This is called viral bronchitis and is best treated by rest, plenty of fluids and control of the cough.  You may use Ibuprofen or Tylenol as directed to help your symptoms.     In addition you may use A non-prescription cough medication called Mucinex DM: take 2 tablets every 12 hours. and A prescription cough medication called Tessalon Perles 100mg . You may take 1-2 capsules every 8 hours as needed for your cough.   From your responses in the eVisit questionnaire you describe inflammation in the upper respiratory tract which is causing a significant cough.  This is commonly called Bronchitis and has four common causes:    Allergies  Viral Infections  Acid Reflux  Bacterial Infection Allergies, viruses and acid reflux are treated by controlling symptoms or eliminating the cause. An example might be a cough caused by taking certain blood pressure medications. You stop the cough by changing the medication. Another example might be a cough caused by acid reflux. Controlling the reflux helps control the cough.  USE OF BRONCHODILATOR ("RESCUE") INHALERS: There is a risk from using your bronchodilator too frequently.  The risk is that over-reliance on a medication which only relaxes the muscles surrounding the breathing tubes can reduce the effectiveness of medications prescribed to reduce swelling and congestion of the tubes themselves.  Although you feel brief relief from the bronchodilator inhaler, your asthma may actually be worsening with the  tubes becoming more swollen and filled with mucus.  This can delay other crucial treatments, such as oral steroid medications. If you need to use a bronchodilator inhaler daily, several times per day, you should discuss this with your provider.  There are probably better treatments that could be used to keep your asthma under control.     HOME CARE . Only take medications as instructed by your medical team. . Complete the entire course of an antibiotic. . Drink plenty of fluids and get plenty of rest. . Avoid close contacts especially the very young and the elderly . Cover your mouth if you cough or cough into your sleeve. . Always remember to wash your hands . A steam or ultrasonic humidifier can help congestion.   GET HELP RIGHT AWAY IF: . You develop worsening fever. . You become short of breath . You cough up blood. . Your symptoms persist after you have completed your treatment plan MAKE SURE YOU   Understand these instructions.  Will watch your condition.  Will get help right away if you are not doing well or get worse.  Your e-visit answers were reviewed by a board certified advanced clinical practitioner to complete your personal care plan.  Depending on the condition, your plan could have included both over the counter or prescription medications. If there is a problem please reply  once you have received a response from your provider. Your safety is important to [place].  If you have drug allergies check your prescription carefully.    You can use MyChart  to ask questions about today's visit, request a non-urgent call back, or ask for a work or school excuse for 24 hours related to this e-Visit. If it has been greater than 24 hours you will need to follow up with your provider, or enter a new e-Visit to address those concerns. You will get an e-mail in the next two days asking about your experience.  I hope that your e-visit has been valuable and will speed your recovery. Thank you  for using e-visits.

## 2017-04-25 ENCOUNTER — Ambulatory Visit (INDEPENDENT_AMBULATORY_CARE_PROVIDER_SITE_OTHER): Payer: No Typology Code available for payment source | Admitting: Physician Assistant

## 2017-04-25 ENCOUNTER — Encounter: Payer: Self-pay | Admitting: Physician Assistant

## 2017-04-25 ENCOUNTER — Ambulatory Visit (INDEPENDENT_AMBULATORY_CARE_PROVIDER_SITE_OTHER): Payer: No Typology Code available for payment source

## 2017-04-25 VITALS — BP 139/75 | HR 90 | Temp 98.7°F | Ht 68.0 in | Wt 183.0 lb

## 2017-04-25 DIAGNOSIS — R05 Cough: Secondary | ICD-10-CM

## 2017-04-25 DIAGNOSIS — R0989 Other specified symptoms and signs involving the circulatory and respiratory systems: Secondary | ICD-10-CM

## 2017-04-25 DIAGNOSIS — R509 Fever, unspecified: Secondary | ICD-10-CM | POA: Diagnosis not present

## 2017-04-25 DIAGNOSIS — J181 Lobar pneumonia, unspecified organism: Secondary | ICD-10-CM

## 2017-04-25 DIAGNOSIS — J189 Pneumonia, unspecified organism: Secondary | ICD-10-CM

## 2017-04-25 DIAGNOSIS — R059 Cough, unspecified: Secondary | ICD-10-CM

## 2017-04-25 MED ORDER — IPRATROPIUM-ALBUTEROL 0.5-2.5 (3) MG/3ML IN SOLN
3.0000 mL | Freq: Once | RESPIRATORY_TRACT | Status: AC
  Administered 2017-04-25: 3 mL via RESPIRATORY_TRACT

## 2017-04-25 MED ORDER — ALBUTEROL SULFATE HFA 108 (90 BASE) MCG/ACT IN AERS: 2.0000 | INHALATION_SPRAY | RESPIRATORY_TRACT | 0 refills | Status: DC | PRN

## 2017-04-25 MED ORDER — AZITHROMYCIN 250 MG PO TABS: ORAL_TABLET | ORAL | 0 refills | Status: DC

## 2017-04-25 MED ORDER — CEFTRIAXONE SODIUM 1 G IJ SOLR
1.0000 g | Freq: Once | INTRAMUSCULAR | Status: AC
  Administered 2017-04-25: 1 g via INTRAMUSCULAR

## 2017-04-25 MED FILL — VENTOLIN HFA 90 MCG INHALER: 108 (90 BAS | 17 days supply | Qty: 18 | Fill #0

## 2017-04-25 MED FILL — AZITHROMYCIN 250 MG TABLET: 250 | 5 days supply | Qty: 6 | Fill #0

## 2017-04-25 NOTE — Progress Notes (Signed)
Subjective:     Patient ID: Tyler Russo, male   DOB: 09-01-1950, 67 y.o.   MRN: [id number]  HPI  Tyler Russo is a 67 year old patient with past medical history including BPH, systolic murmur, PVCs presents to the office with chief complain of flu like symptoms.   His symptoms started last Friday with annoying cough. Next day he stated feeling sore throat chest and sinus congestion. He also felt warm but denies checking his temperature. He reports occasional chills worsening cough, occasional headaches, post nasal drip and increased dark green sputum. Patient reports that he started feeling worse in last two days. He checked his temperature at home (100.2 F) yesterday. Patient feels lethargic with no appetite or energy. Patient denies chest pain, night sweats, ear pain, change in vision or hearing, hemoptysis. Patient denies nausea, vomiting, diarrhea, constipation. Patient was with his granddaughter on Wednesday who is diagnosed with influenza. 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  .. Active Ambulatory Problems    Diagnosis Date Noted  . Palpitations 11/17/2015  . Systolic ejection murmur 84/13/2440  . History of BPH 11/17/2015  . Abnormal stress test 12/29/2015  . 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 PVC's (premature ventricular contractions) 03/01/2017  . Atrial tachycardia determined by electrocardiography ([place]) 03/01/2017  . Cardiac murmur 04/14/2017   Resolved Ambulatory Problems    Diagnosis Date Noted  . No Resolved Ambulatory Problems   Past Medical History:  Diagnosis Date  . Prostate abscess    Review of Systems   As stated in HPI.     Objective:   Physical Exam  Constitutional: He is oriented to person, place, and time. He appears well-developed and well-nourished. No distress.  HENT:  Head: Normocephalic and atraumatic.  Right Ear: External ear normal.  Left Ear: External ear normal.  Nose: Nose normal.  Mouth/Throat: Oropharynx is clear and moist.  No oropharyngeal exudate.  Eyes: Conjunctivae are normal. Pupils are equal, round, and reactive to light. Right eye exhibits no discharge. Left eye exhibits no discharge. No scleral icterus.  Neck: Normal range of motion. Neck supple. No tracheal deviation present. No thyromegaly present.  Cardiovascular: Regular rhythm and normal heart sounds. Exam reveals no gallop and no friction rub.  No murmur heard. Tachycardic  Pulmonary/Chest: Effort normal. No respiratory distress. He has no wheezes. He exhibits no tenderness.  Diffuse crackles throughout all lung fields bilaterally Left>Right and worse at the base.   Lymphadenopathy:    He has no cervical adenopathy.  Neurological: He is alert and oriented to person, place, and time. Coordination normal.  Skin: Skin is warm and dry. No rash noted. No erythema. No pallor.  Psychiatric: He has a normal mood and affect. His behavior is normal. Judgment and thought content normal.      Assessment:    Tyler KitchenMarland KitchenUra was seen today for cough and headache.  Diagnoses and all orders for this visit:  Community acquired pneumonia of left lower lobe of lung ([place]) -     ipratropium-albuterol (DUONEB) 0.5-2.5 (3) MG/3ML nebulizer solution 3 mL -     azithromycin (ZITHROMAX) 250 MG tablet; Take 2 tablets now and then one tablet for 4 days. -     albuterol (PROVENTIL HFA;VENTOLIN HFA) 108 (90 Base) MCG/ACT inhaler; Inhale 2 puffs into the lungs every 4 (four) hours as needed for wheezing or shortness of breath (cough). -     cefTRIAXone (ROCEPHIN) injection 1 g  Cough -     DG Chest 2  View -     azithromycin (ZITHROMAX) 250 MG tablet; Take 2 tablets now and then one tablet for 4 days. -     albuterol (PROVENTIL HFA;VENTOLIN HFA) 108 (90 Base) MCG/ACT inhaler; Inhale 2 puffs into the lungs every 4 (four) hours as needed for wheezing or shortness of breath (cough).  Lung crackles -     DG Chest 2 View -     ipratropium-albuterol (DUONEB) 0.5-2.5 (3) MG/3ML  nebulizer solution 3 mL -     azithromycin (ZITHROMAX) 250 MG tablet; Take 2 tablets now and then one tablet for 4 days. -     albuterol (PROVENTIL HFA;VENTOLIN HFA) 108 (90 Base) MCG/ACT inhaler; Inhale 2 puffs into the lungs every 4 (four) hours as needed for wheezing or shortness of breath (cough).  Fever, unspecified fever cause -     DG Chest 2 View -     azithromycin (ZITHROMAX) 250 MG tablet; Take 2 tablets now and then one tablet for 4 days.      Plan:     Patient presents with cough, chest congestion, productive cough with green sputum, fever, and diffuse crackles in all lung fields (L>R). Patient's symptoms most consistent with pneumonia. Etiology unknown, could be viral or bacterial. Will treat him with antibiotics including PO azithromycin and  IM ceftriaxone.   Patient reported lots of chest congestion and cough. A Nebulizer treatment with Duoneb will help open bronchi and moist secretions and help in mucus clearance. Pt reported a lot of improvement after nebulizer. He was sent home inhaler to use as needed ever 4-6 hours.   Patient is instructed to have a chest x ray for further diagnosis and management of his pneumonia.  Patient was instructed to keep himself well hydrated, get plenty of rest, continue using breathing inhaler as needed to relieve chest congestion. HO was given. Patient was instructed to call the office if any of his symptoms worsen.

## 2017-04-25 NOTE — Patient Instructions (Signed)

## 2017-04-26 ENCOUNTER — Encounter: Payer: Self-pay | Admitting: Physician Assistant

## 2017-04-26 NOTE — Progress Notes (Signed)
Suprisingly radiologist did not see any infiltrate; however, by knowing where your lungs sounded the worse I can see where consolidation appears to be starting. Treatment plan stays the same. How are you feeling today?

## 2017-05-02 ENCOUNTER — Telehealth: Payer: Self-pay | Admitting: Cardiology

## 2017-05-02 ENCOUNTER — Ambulatory Visit (HOSPITAL_COMMUNITY): Payer: No Typology Code available for payment source | Attending: Cardiology

## 2017-05-02 ENCOUNTER — Ambulatory Visit (INDEPENDENT_AMBULATORY_CARE_PROVIDER_SITE_OTHER): Payer: No Typology Code available for payment source

## 2017-05-02 ENCOUNTER — Other Ambulatory Visit: Payer: Self-pay

## 2017-05-02 DIAGNOSIS — I351 Nonrheumatic aortic (valve) insufficiency: Secondary | ICD-10-CM | POA: Diagnosis not present

## 2017-05-02 DIAGNOSIS — Z8249 Family history of ischemic heart disease and other diseases of the circulatory system: Secondary | ICD-10-CM | POA: Diagnosis not present

## 2017-05-02 DIAGNOSIS — I517 Cardiomegaly: Secondary | ICD-10-CM | POA: Diagnosis not present

## 2017-05-02 DIAGNOSIS — R002 Palpitations: Secondary | ICD-10-CM

## 2017-05-02 DIAGNOSIS — R011 Cardiac murmur, unspecified: Secondary | ICD-10-CM | POA: Diagnosis not present

## 2017-05-02 NOTE — Telephone Encounter (Signed)
I received a page from [place]. The patient had an event monitor placed this afternoon.  The pt had 13 beat run of Vtach with rate of 151 bpm. Underlying sinus rhythm in the 60's with PVC's. I called the patient who denies any chest discomfort, lightheadedness, dyspnea, syncope/near syncope. He was taking a 20 minute walk that he does everyday and was feeling well. He does note intermittent, frequent palpitations, which he has been having for about a year. He notices them more when he is quiet. He took his BP while talked to him- 148/84. I advised him to avoid caffeine. He is not taking any herbal supplements or other cardiac stimulants. He finished zithromax last Sunday. We reviewed symptoms to report and when to call 911. His wife will watch for syncope. He was very appreciative of the call.   I will route this to the triage nurse and Dr. Gwenlyn Found for review in the morning.   Note- Mrs. Armand notes that Mr. Skolnick stops breathing at night and she suspects sleep apnea. Consider sleep study.   Daune Perch, AGNP-C [facility] 05/02/2017  8:50 PM

## 2017-05-03 ENCOUNTER — Telehealth: Payer: Self-pay | Admitting: Cardiovascular Disease

## 2017-05-03 NOTE — Telephone Encounter (Signed)
New message    Patient is returning call from [place]

## 2017-05-03 NOTE — Telephone Encounter (Signed)
Patient has been scheduled to see MD on 05/04/17

## 2017-05-03 NOTE — Telephone Encounter (Signed)
Spoke with Dr. Gwenlyn Found and reviewed event monitor findings. Dr. Gwenlyn Found suggested that patient come in to see him on Friday March 22.   LM for patient to call back to schedule appointment.

## 2017-05-03 NOTE — Telephone Encounter (Signed)
Returned call to patient. Explained MD would like him to be seen for event monitor findings. Scheduled for 05/04/17 @ 0800  Monitor report given to Alvino Blood to put with records for appt

## 2017-05-04 ENCOUNTER — Encounter: Payer: Self-pay | Admitting: Cardiovascular Disease

## 2017-05-04 ENCOUNTER — Ambulatory Visit: Payer: No Typology Code available for payment source | Admitting: Cardiovascular Disease

## 2017-05-04 VITALS — BP 100/64 | HR 78 | Ht 68.0 in | Wt 182.0 lb

## 2017-05-04 DIAGNOSIS — I4729 Other ventricular tachycardia: Secondary | ICD-10-CM

## 2017-05-04 DIAGNOSIS — I472 Ventricular tachycardia: Secondary | ICD-10-CM | POA: Diagnosis not present

## 2017-05-04 DIAGNOSIS — R002 Palpitations: Secondary | ICD-10-CM | POA: Diagnosis not present

## 2017-05-04 MED ORDER — METOPROLOL TARTRATE 25 MG PO TABS: 12.5000 mg | ORAL_TABLET | Freq: Two times a day (BID) | ORAL | 6 refills | Status: DC

## 2017-05-04 MED FILL — METOPROLOL TARTRATE 25 MG T: 25 | 60 days supply | Qty: 60 | Fill #0

## 2017-05-04 NOTE — Progress Notes (Signed)
Tyler Russo returns today for follow-up of his monitor which showed runs of nonsustained ventricular tachycardia.he denies chest pain or shortness of breath. 2-D echo was completely normal. I'm going to begin him on low-dose beta blocker and arrange for him to undergo a coronary CTA early next week. I'm also going to arrange for him see Dr. Lovena Le for EP evaluation.   Lorretta Harp, M.D., [place], [facility], [place] [address] [place],   [address]  [phone] 05/04/2017 8:30 AM

## 2017-05-04 NOTE — Patient Instructions (Addendum)
Medication Instructions: Your physician recommends that you continue on your current medications as directed. Please refer to the Current Medication list given to you today.  START Metoprolol 25 mg---take 1/2 tab (12.5 mg) twice daily.    Testing:  Please arrive at the [facility] main entrance of [facility] at                    AM (30-45 minutes prior to test start time)  [facility] [address] [place], [place] [address] [phone]  Proceed to the [facility] Radiology Department (First Floor).  Please follow these instructions carefully (unless otherwise directed):  On the Night Before the Test: . Drink plenty of water. . Do not consume any caffeinated/decaffeinated beverages or chocolate 12 hours prior to your test. . Do not take any antihistamines 12 hours prior to your test.  On the Day of the Test: . Drink plenty of water. Do not drink any water within one hour of the test. . Do not eat any food 4 hours prior to the test. . You may take your regular medications prior to the test.  After the Test: . Drink plenty of water. . After receiving IV contrast, you may experience a mild flushed feeling. This is normal. . On occasion, you may experience a mild rash up to 24 hours after the test. This is not dangerous. If this occurs, you can take Benadryl 25 mg and increase your fluid intake. . If you experience trouble breathing, this can be serious. If it is severe call 911 IMMEDIATELY. If it is mild, please call our office. . If you take any of these medications: Glipizide/Metformin, Avandament, Glucavance, please do not take 48 hours after completing test.   Follow-Up:  Your physician recommends that you schedule a follow-up appointment in: 2 weeks with Dr. Andria Rhein have been referred to Dr. Cristopher Peru for NSVT.

## 2017-05-04 NOTE — Assessment & Plan Note (Signed)
Mr. Tyler Russo returns today for follow-up of his monitor which showed runs of nonsustained ventricular tachycardia.he denies chest pain or shortness of breath. 2-D echo was completely normal. I'm going to begin him on low-dose beta blocker and arrange for him to undergo a coronary CTA early next week. I'm also going to arrange for him see Dr. Lovena Le for EP evaluation.

## 2017-05-05 ENCOUNTER — Ambulatory Visit (HOSPITAL_COMMUNITY)
Admission: RE | Admit: 2017-05-05 | Discharge: 2017-05-05 | Disposition: A | Discharge status: Home / Self Care | Payer: No Typology Code available for payment source | Source: Ambulatory Visit | Attending: Cardiovascular Disease | Admitting: Cardiovascular Disease

## 2017-05-05 ENCOUNTER — Encounter (HOSPITAL_COMMUNITY): Payer: Self-pay

## 2017-05-05 DIAGNOSIS — I4729 Other ventricular tachycardia: Secondary | ICD-10-CM

## 2017-05-05 DIAGNOSIS — I472 Ventricular tachycardia: Secondary | ICD-10-CM | POA: Diagnosis not present

## 2017-05-05 DIAGNOSIS — I482 Chronic atrial fibrillation: Secondary | ICD-10-CM | POA: Diagnosis not present

## 2017-05-05 DIAGNOSIS — I2510 Atherosclerotic heart disease of native coronary artery without angina pectoris: Secondary | ICD-10-CM | POA: Insufficient documentation

## 2017-05-05 MED ORDER — NITROGLYCERIN 0.4 MG SL SUBL
0.8000 mg | SUBLINGUAL_TABLET | Freq: Once | SUBLINGUAL | Status: AC
  Administered 2017-05-05: 0.8 mg via SUBLINGUAL

## 2017-05-05 MED ORDER — NITROGLYCERIN 0.4 MG SL SUBL
SUBLINGUAL_TABLET | SUBLINGUAL | Status: AC
  Administered 2017-05-05: 0.8 mg via SUBLINGUAL
  Filled 2017-05-05: qty 2

## 2017-05-05 MED ORDER — IOPAMIDOL (ISOVUE-370) INJECTION 76%
INTRAVENOUS | Status: AC
  Administered 2017-05-05: 100 mL
  Filled 2017-05-05: qty 100

## 2017-05-05 NOTE — Progress Notes (Signed)
Patient tolerated CT without incident. PO Challenge with water and crackers well. Ambulated steady gait to exit. No distress.

## 2017-05-14 ENCOUNTER — Encounter: Payer: Self-pay | Admitting: Cardiovascular Disease

## 2017-05-19 ENCOUNTER — Ambulatory Visit: Payer: No Typology Code available for payment source | Admitting: Cardiovascular Disease

## 2017-05-19 ENCOUNTER — Encounter: Payer: Self-pay | Admitting: Cardiovascular Disease

## 2017-05-19 VITALS — BP 94/62 | HR 64 | Ht 68.0 in | Wt 182.0 lb

## 2017-05-19 DIAGNOSIS — E7800 Pure hypercholesterolemia, unspecified: Secondary | ICD-10-CM

## 2017-05-19 DIAGNOSIS — I2510 Atherosclerotic heart disease of native coronary artery without angina pectoris: Secondary | ICD-10-CM | POA: Diagnosis not present

## 2017-05-19 DIAGNOSIS — E785 Hyperlipidemia, unspecified: Secondary | ICD-10-CM | POA: Diagnosis not present

## 2017-05-19 DIAGNOSIS — R002 Palpitations: Secondary | ICD-10-CM | POA: Diagnosis not present

## 2017-05-19 MED ORDER — ROSUVASTATIN CALCIUM 5 MG PO TABS: 5.0000 mg | ORAL_TABLET | ORAL | 6 refills | Status: DC

## 2017-05-19 MED FILL — ROSUVASTATIN CALCIUM 5 MG T: 5 | 60 days supply | Qty: 30 | Fill #0

## 2017-05-19 NOTE — Patient Instructions (Signed)
Medication Instructions:   START Crestor (rosuvastatin) 5 mg every other day.   Labwork: Your physician recommends that you return for a FASTING lipid profile and hepatic function panel around May 17 (6 weeks).   Follow-Up: Your physician wants you to follow-up in: 6 months with Dr. Gwenlyn Found. You will receive a reminder letter in the mail two months in advance. If you don't receive a letter, please call our office to schedule the follow-up appointment.  If you need a refill on your cardiac medications before your next appointment, please call your pharmacy.

## 2017-05-19 NOTE — Progress Notes (Signed)
Tyler Russo returns today for follow-up. He has a appointment with Dr. Lovena Le next Tuesday to discuss his nonsustained VT. His palpitations which now probably representing nonsustained VT have completely resolved a low-dose beta blockade although his blood pressure slightly lower as well. I'm going to begin him on low-dose Crestor 5 mg every other day to optimize his risk factors and will check a lipid liver profile in 6-8 weeks. I will see him back in 6 months for follow-up.  Lorretta Harp, M.D., [place], [facility], [place] [address] [place], [place]  [address]  [phone] 05/19/2017 8:56 AM

## 2017-05-19 NOTE — Assessment & Plan Note (Signed)
Event monitor showed nonsustained ventricular tachycardia. I put him on low-dose beta blocker which has completely resolved his palpitations. He does have normal LV function by 2-D echocardiography. He is scheduled to see Dr. Lovena Le in the office next week for further evaluation.

## 2017-05-19 NOTE — Assessment & Plan Note (Signed)
LDL 2 years ago was 102. Given mild to moderate LAD disease by CTA I'm going to begin him on low-dose Crestor 5 mg every other day for optimization of his risk factors and will recheck a lipid and liver profile in 2 months.

## 2017-05-19 NOTE — Assessment & Plan Note (Signed)
Coronary CTA performed 05/05/17 that showed coronary calcium score of 227 with mild to moderate LAD D disease in the proximal to midportion although FFR analysis did not suggest significance. Agree with aggressive risk factor modification.

## 2017-05-23 ENCOUNTER — Ambulatory Visit: Payer: No Typology Code available for payment source | Admitting: Internal Medicine

## 2017-06-13 ENCOUNTER — Encounter: Payer: Self-pay | Admitting: Physician Assistant

## 2017-06-13 DIAGNOSIS — L989 Disorder of the skin and subcutaneous tissue, unspecified: Secondary | ICD-10-CM

## 2017-07-03 ENCOUNTER — Encounter: Payer: Self-pay | Admitting: Physician Assistant

## 2017-07-03 MED FILL — METOPROLOL TARTRATE 25 MG T: 25 | 60 days supply | Qty: 60 | Fill #1

## 2017-07-03 MED FILL — ROSUVASTATIN CALCIUM 5 MG T: 5 | 60 days supply | Qty: 30 | Fill #1

## 2017-07-07 LAB — HEPATIC FUNCTION PANEL
ALT: 31 IU/L (ref 0–44)
AST: 27 IU/L (ref 0–40)
Albumin: 4.7 g/dL (ref 3.6–4.8)
Alkaline Phosphatase: 76 IU/L (ref 39–117)
BILIRUBIN TOTAL: 0.6 mg/dL (ref 0.0–1.2)
Bilirubin, Direct: 0.19 mg/dL (ref 0.00–0.40)
TOTAL PROTEIN: 7.3 g/dL (ref 6.0–8.5)

## 2017-07-07 LAB — LIPID PANEL
Chol/HDL Ratio: 2.4 ratio (ref 0.0–5.0)
Cholesterol, Total: 119 mg/dL (ref 100–199)
HDL: 49 mg/dL (ref >39)
LDL CALC: 59 mg/dL (ref 0–99)
Triglycerides: 57 mg/dL (ref 0–149)
VLDL CHOLESTEROL CAL: 11 mg/dL (ref 5–40)

## 2017-07-28 ENCOUNTER — Ambulatory Visit (INDEPENDENT_AMBULATORY_CARE_PROVIDER_SITE_OTHER): Payer: No Typology Code available for payment source | Admitting: Osteopathic Medicine

## 2017-07-28 ENCOUNTER — Encounter: Payer: Self-pay | Admitting: Osteopathic Medicine

## 2017-07-28 ENCOUNTER — Encounter: Payer: Self-pay | Admitting: Physician Assistant

## 2017-07-28 VITALS — BP 127/68 | HR 48 | Temp 97.9°F | Wt 175.2 lb

## 2017-07-28 DIAGNOSIS — H00012 Hordeolum externum right lower eyelid: Secondary | ICD-10-CM | POA: Diagnosis not present

## 2017-07-28 MED ORDER — ERYTHROMYCIN 5 MG/GM OP OINT: TOPICAL_OINTMENT | OPHTHALMIC | 0 refills | Status: AC

## 2017-07-28 MED FILL — NEO/POLY/DEXAMET EYE OINT: 3.5-10000-0 | 5 days supply | Qty: 4 | Fill #0

## 2017-07-28 NOTE — Progress Notes (Signed)
HPI: Tyler Russo is a 67 y.o. male who  has a past medical history of Prostate abscess.  he presents to [facility] today, 07/28/17,  for chief complaint of:  Eye problem  R eye problem on lower medial lid. Started about 1.5 weeks ago, stye, this drained and now he feels a type of hardness/tenderness that doesn't seem to be going away. Was doing warm compresses but stopped this a fe days ago since it was getting better, but last night started to get worse again.     Past medical history, surgical history, and family history reviewed.  Current medication list and allergy/intolerance information reviewed.   (See remainder of HPI, ROS, Phys Exam below)    ASSESSMENT/PLAN:   Hordeolum externum of right lower eyelid - Plan: Ambulatory referral to Ophthalmology    Follow-up plan: No follow-ups on file.     ############################################ ############################################ ############################################ ############################################    Outpatient Encounter Medications as of 07/28/2017  Medication Sig  . metoprolol tartrate (LOPRESSOR) 25 MG tablet Take 0.5 tablets (12.5 mg total) by mouth 2 (two) times daily.  . rosuvastatin (CRESTOR) 5 MG tablet Take 1 tablet (5 mg total) by mouth every other day.   No facility-administered encounter medications on file as of 07/28/2017.    No Known Allergies    Review of Systems:  Constitutional: No recent illness  HEENT: No  headache, no vision change  Skin: No  Rash except on eye   Exam:  BP 127/68 (BP Location: Left Arm, Patient Position: Sitting, Cuff Size: Normal)   Pulse (!) 48   Temp 97.9 F (36.6 C) (Oral)   Wt 175 lb 3.2 oz (79.5 kg)   BMI 26.64 kg/m   Constitutional: VS see above. General Appearance: alert, well-developed, well-nourished, NAD  Eyes: Normal conjunctive, non-icteric sclera, EOMI, PERRL. Tender subq nodule R lower  lid medially, nondraining, no ulceration, (+)erythema, feels firm.   Ears, Nose, Mouth, Throat: MMM, Normal external inspection ears/nares/mouth/lips/gums.  Neck: No masses, trachea midline.   Respiratory: Normal respiratory effort.    Visit summary with medication list and pertinent instructions was printed for patient to review, advised to alert [place] if any changes needed. All questions at time of visit were answered - patient instructed to contact office with any additional concerns. ER/RTC precautions were reviewed with the patient and understanding verbalized.   Follow-up plan: Return for routine care w/ PCP as directed.  Note: Total time spent 15 minutes, greater than 50% of the visit was spent face-to-face counseling and coordinating care for the following: The encounter diagnosis was Hordeolum externum of right lower eyelid..  Please note: voice recognition software was used to produce this document, and typos may escape review. Please contact Dr. Sheppard Coil for any needed clarifications.

## 2017-08-31 ENCOUNTER — Encounter: Payer: Self-pay | Admitting: Physician Assistant

## 2017-09-08 MED FILL — METOPROLOL TARTRATE 25 MG T: 25 | 60 days supply | Qty: 60 | Fill #2

## 2017-09-20 ENCOUNTER — Encounter: Payer: Self-pay | Admitting: Physician Assistant

## 2017-09-22 MED FILL — ROSUVASTATIN CALCIUM 5 MG T: 5 | 60 days supply | Qty: 30 | Fill #2

## 2017-09-25 ENCOUNTER — Telehealth: Payer: Self-pay | Admitting: Cardiovascular Disease

## 2017-09-25 NOTE — Telephone Encounter (Signed)
New Message:  Patient left his medication (Metroprolol Tartrate 25mg ) to his daughter house and his requesting a refill until Saturday when he will pick up the medication.

## 2017-09-26 MED ORDER — METOPROLOL TARTRATE 25 MG PO TABS: 12.5000 mg | ORAL_TABLET | Freq: Two times a day (BID) | ORAL | 3 refills | Status: DC

## 2017-09-26 MED FILL — METOPROLOL TARTRATE 25 MG T: 25 | 90 days supply | Qty: 90 | Fill #0

## 2017-09-26 NOTE — Telephone Encounter (Signed)
PATIENT AWARE MEDICATION E-SENT TO PHARMACY

## 2017-09-26 NOTE — Telephone Encounter (Signed)
New message:    Patient left his medication (Metroprolol Tartrate 25mg ) to his daughter house and his requesting a refill until Saturday when he will pick up the medication.

## 2017-09-26 NOTE — Telephone Encounter (Signed)
I sent referral to [facility] [place] as requested patient can call them and see if they have had any cancellations. - CF

## 2017-10-11 MED FILL — CLOBETASOL 0.05% SOLUTION: 0.05 | 15 days supply | Qty: 50 | Fill #0

## 2017-10-11 MED FILL — KETOCONAZOLE 2% SHAMPOO: 2 | 30 days supply | Qty: 120 | Fill #0

## 2017-11-27 MED FILL — ROSUVASTATIN CALCIUM 5 MG T: 5 | 90 days supply | Qty: 45 | Fill #3

## 2017-12-06 ENCOUNTER — Ambulatory Visit (INDEPENDENT_AMBULATORY_CARE_PROVIDER_SITE_OTHER): Payer: Self-pay | Admitting: Nurse Practitioner

## 2017-12-06 DIAGNOSIS — Z23 Encounter for immunization: Secondary | ICD-10-CM

## 2017-12-06 NOTE — Patient Instructions (Signed)
Immunization status: patient received influenza shot today,m tolerated well, VIS form was given.

## 2017-12-08 ENCOUNTER — Ambulatory Visit: Payer: No Typology Code available for payment source | Admitting: Cardiovascular Disease

## 2017-12-08 ENCOUNTER — Encounter: Payer: Self-pay | Admitting: Cardiovascular Disease

## 2017-12-08 VITALS — BP 112/74 | HR 54 | Ht 68.0 in | Wt 179.0 lb

## 2017-12-08 DIAGNOSIS — R011 Cardiac murmur, unspecified: Secondary | ICD-10-CM

## 2017-12-08 DIAGNOSIS — R002 Palpitations: Secondary | ICD-10-CM

## 2017-12-08 DIAGNOSIS — E7800 Pure hypercholesterolemia, unspecified: Secondary | ICD-10-CM

## 2017-12-08 NOTE — Assessment & Plan Note (Signed)
History of palpitations with event monitoring performed March of this year that showed PVCs and nonsustained ventricular tachycardia.  He did have some brief PAF.  I did put him on low-dose beta-blocker which had markedly improved his symptoms although recently they become more noticeable.  He has had a negative Myoview and a normal 2D echo in the past.  He denies chest pain or shortness of breath or presyncope.  His heart rate is too slow to titrate his beta-blocker at this time.  I will see him back in 4 months for follow-up.

## 2017-12-08 NOTE — Assessment & Plan Note (Signed)
History of a systolic ejection murmur in the past although none on exam today with a normal 2D echo 05/02/2017 without evidence of valvular abnormalities.

## 2017-12-08 NOTE — Patient Instructions (Signed)
Medication Instructions:  NO CHANGE If you need a refill on your cardiac medications before your next appointment, please call your pharmacy.   Lab work: If you have labs (blood work) drawn today and your tests are completely normal, you will receive your results only by: Marland Kitchen MyChart Message (if you have MyChart) OR . A paper copy in the mail If you have any lab test that is abnormal or we need to change your treatment, we will call you to review the results.  Follow-Up: Your physician recommends that you schedule a follow-up appointment in: MARCH 2020 [place]

## 2017-12-08 NOTE — Progress Notes (Signed)
12/08/2017 Tyler Russo   [birth year]  [id number]  Primary Physician Donella Stade, PA-C Primary Cardiologist: Lorretta Harp MD Tyler Russo, [place]  HPI:  Tyler Russo is a 67 y.o.  fit-appearing married Caucasian male father of 44, grandfather and 6 grandchildren referred for cardiac vessel evaluation because of symptomatic palpitations. His primary care provider is [facility].  I last saw him in the office 05/19/2017.  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 He had a negative Myoview stress test performed 01/12/16 and an event monitor that showed nonspecific sustained. Atrial tachycardia without other sustained rhythms, rare PVCs. He briefly has no cardiac risk factors. He denies chest pain or shortness of breath. Never had a heart attack or stroke. He does have a 10 year history of a cardiac murmur. This has not been evaluated recently. He is fairly active and walks 2-3 miles a day with his wife.  We did an echocardiogram on him as well as a IV stress test all of which were normal.  He did have an event monitor that showed PVCs and short runs of nonsustained ventricular tachycardia as well as short runs of PAF.  I did put him on low-dose beta-blockade which markedly improved his symptoms although within the last several weeks he had recurrence of symptomatic palpitations.   Current Meds  Medication Sig  . erythromycin ophthalmic ointment Apply 1/2 inch ribbon to affected eye QID for 5 days.  . metoprolol tartrate (LOPRESSOR) 25 MG tablet Take 0.5 tablets (12.5 mg total) by mouth 2 (two) times daily.     No Known Allergies  Social History   Socioeconomic History  . Marital status: Married    Spouse name: Not on file  . Number of children: Not on file  . Years of education: Not on file  . Highest education level: Not on file  Occupational History  . Not on file  Social Needs  .  Financial resource strain: Not on file  . Food insecurity:    Worry: Not on file    Inability: Not on file  . Transportation needs:    Medical: Not on file    Non-medical: Not on file  Tobacco Use  . Smoking status: Never Smoker  . Smokeless tobacco: Never Used  Substance and Sexual Activity  . Alcohol use: Yes    Alcohol/week: 2.0 standard drinks    Types: 2 Standard drinks or equivalent per week  . Drug use: No  . Sexual activity: Yes  Lifestyle  . Physical activity:    Days per week: Not on file    Minutes per session: Not on file  . Stress: Not on file  Relationships  . Social connections:    Talks on phone: Not on file    Gets together: Not on file    Attends religious service: Not on file    Active member of club or organization: Not on file    Attends meetings of clubs or organizations: Not on file    Relationship status: Not on file  . Intimate partner violence:    Fear of current or ex partner: Not on file    Emotionally abused: Not on file    Physically abused: Not on file    Forced sexual activity: Not on file  Other Topics Concern  . Not on file  Social History Narrative  . Not on file  Review of Systems: General: negative for chills, fever, night sweats or weight changes.  Cardiovascular: negative for chest pain, dyspnea on exertion, edema, orthopnea, palpitations, paroxysmal nocturnal dyspnea or shortness of breath Dermatological: negative for rash Respiratory: negative for cough or wheezing Urologic: negative for hematuria Abdominal: negative for nausea, vomiting, diarrhea, bright red blood per rectum, melena, or hematemesis Neurologic: negative for visual changes, syncope, or dizziness All other systems reviewed and are otherwise negative except as noted above.    Blood pressure 112/74, pulse (!) 54, height 5\' 8"  (1.727 m), weight 179 lb (81.2 kg).  General appearance: alert and no distress Neck: no adenopathy, no carotid bruit, no JVD, supple,  symmetrical, trachea midline and thyroid not enlarged, symmetric, no tenderness/mass/nodules Lungs: clear to auscultation bilaterally Heart: regular rate and rhythm, S1, S2 normal, no murmur, click, rub or gallop Extremities: extremities normal, atraumatic, no cyanosis or edema Pulses: 2+ and symmetric Skin: Skin color, texture, turgor normal. No rashes or lesions Neurologic: Alert and oriented X 3, normal strength and tone. Normal symmetric reflexes. Normal coordination and gait  EKG sinus bradycardia 54 without ST or T wave changes.  I personally reviewed this EKG  ASSESSMENT AND PLAN:   Palpitations History of palpitations with event monitoring performed March of this year that showed PVCs and nonsustained ventricular tachycardia.  He did have some brief PAF.  I did put him on low-dose beta-blocker which had markedly improved his symptoms although recently they become more noticeable.  He has had a negative Myoview and a normal 2D echo in the past.  He denies chest pain or shortness of breath or presyncope.  His heart rate is too slow to titrate his beta-blocker at this time.  I will see him back in 4 months for follow-up.  Systolic ejection murmur History of a systolic ejection murmur in the past although none on exam today with a normal 2D echo 05/02/2017 without evidence of valvular abnormalities.  Hyperlipidemia History of hyperlipidemia on statin therapy lipid profile performed 07/07/2017 revealing total cholesterol 119, LDL 59 HDL [place] MD [facility], [facility] 12/08/2017 12:11 PM

## 2017-12-08 NOTE — Assessment & Plan Note (Signed)
History of hyperlipidemia on statin therapy lipid profile performed 07/07/2017 revealing total cholesterol 119, LDL 59 HDL 49

## 2018-05-10 ENCOUNTER — Telehealth: Payer: Self-pay | Admitting: Cardiovascular Disease

## 2018-05-10 NOTE — Telephone Encounter (Signed)
FYI to make provider aware. Thanks!

## 2018-05-10 NOTE — Telephone Encounter (Signed)
New message   Patient states that he has moved and states that he will be seeing another provider.

## 2018-05-11 ENCOUNTER — Ambulatory Visit: Payer: No Typology Code available for payment source | Admitting: Cardiovascular Disease

## 2018-06-21 IMAGING — NM NM MISC PROCEDURE
3 series · 18 of 18 positions shown · non-contrast
Comparison: none

[Series 1: wbr_s-proj_st stress_(id)_sa · 6.5mm · 6.51mm/px · 6 of 64 frames shown (1 of 2)]
[frame 6/64]
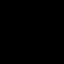
[frame 16/64]
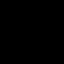
[frame 27/64]
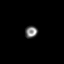
[frame 38/64]
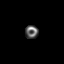
[frame 48/64]
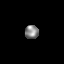
[frame 59/64]
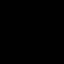

[Series 1: wbr_r-proj_st rest_(id)_sa · 6.5mm · 6.51mm/px · 6 of 64 frames shown]
[frame 6/64]
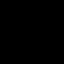
[frame 16/64]
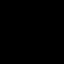
[frame 27/64]
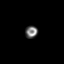
[frame 38/64]
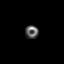
[frame 48/64]
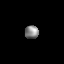
[frame 59/64]
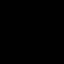

[Series 1: wbr_s-proj_st stress_(id)_sa · 6.5mm · 6.51mm/px · 6 of 512 frames shown (2 of 2)]
[frame 43/512]
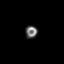
[frame 128/512]
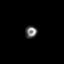
[frame 214/512]
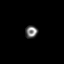
[frame 299/512]
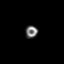
[frame 384/512]
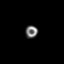
[frame 470/512]
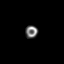

[18 of 18 positions shown; findings below may reference images not displayed]

Canned report from images found in remote index.

Refer to host system for actual result text.

## 2018-08-30 ENCOUNTER — Other Ambulatory Visit: Payer: Self-pay | Admitting: Cardiovascular Disease

## 2019-10-03 IMAGING — DX DG CHEST 2V
2 series · 2 of 2 positions shown · non-contrast
Comparison: None.

CLINICAL DATA: Cough.  Fever and crackles

EXAM:
CHEST - 2 VIEW

[chest pa]
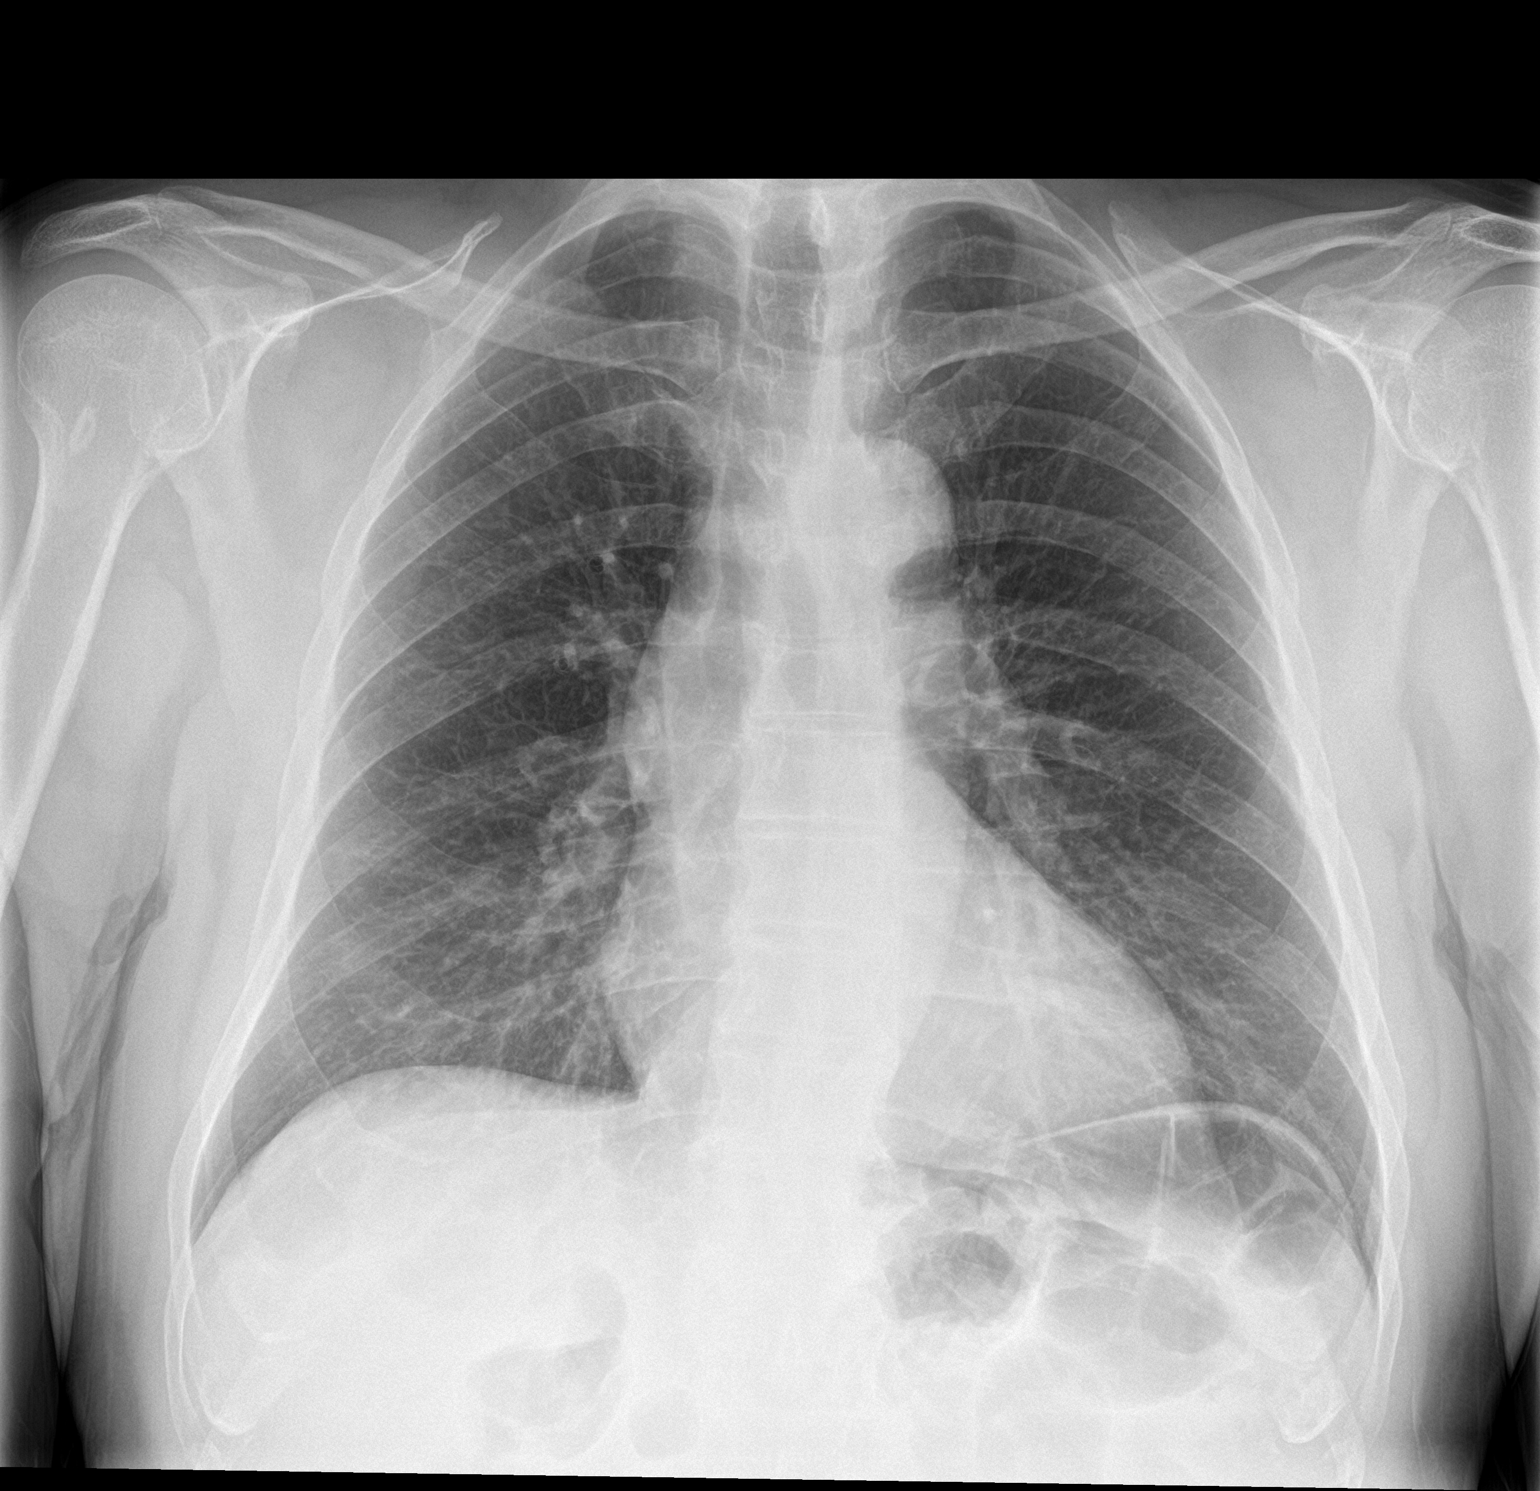

[chest lat]
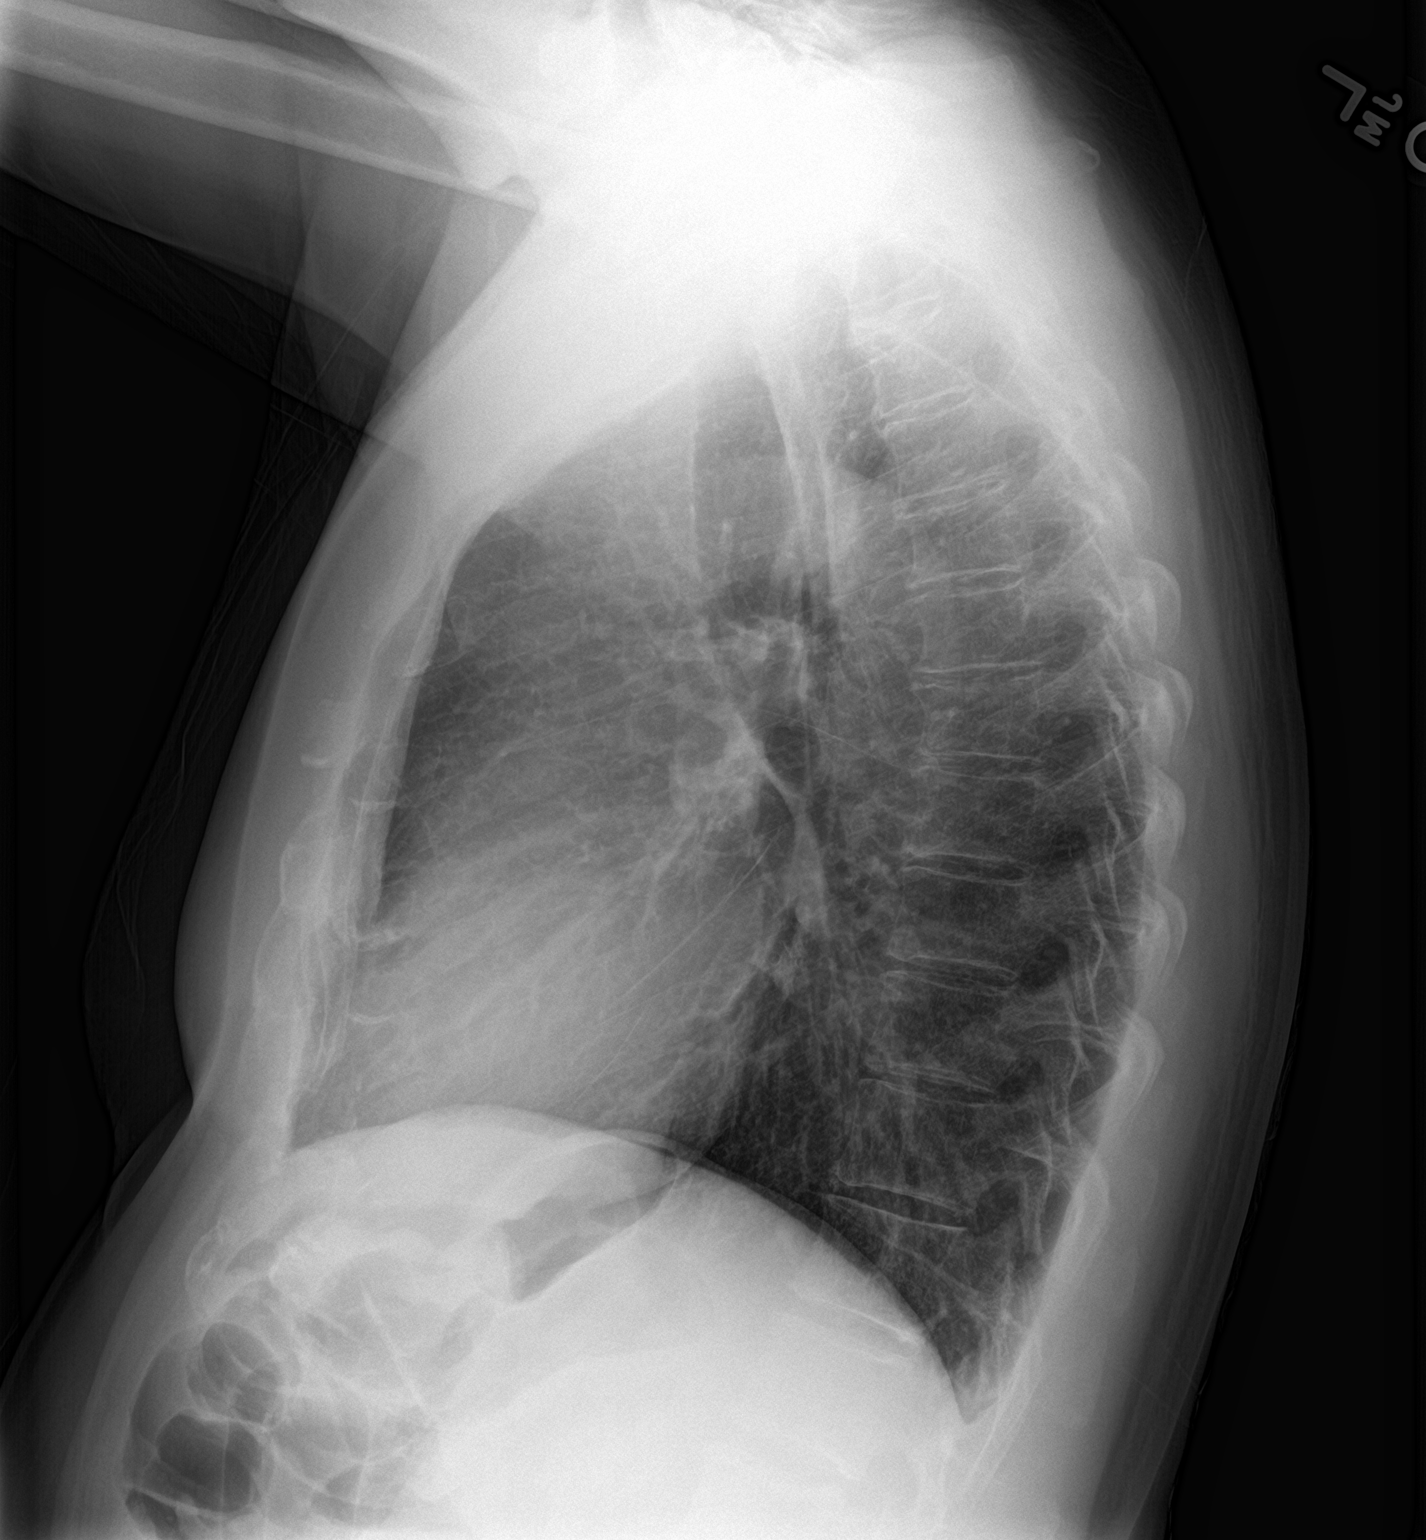

[2 of 2 positions shown; findings below may reference images not displayed]

FINDINGS: Normal heart size and mediastinal contours. No acute infiltrate or
edema. No effusion or pneumothorax. No acute osseous findings.
IMPRESSION: Negative chest.
